# Patient Record
Sex: Female | Born: 1955 | Race: White | Hispanic: No | Marital: Married | Smoking: Former smoker
Health system: Southern US, Community
[De-identification: ages and names within clinical notes are randomized; demographics above are authoritative.]

## PROBLEM LIST (undated history)

## (undated) DIAGNOSIS — J0510 Acute epiglottitis without obstruction: Secondary | ICD-10-CM

## (undated) DIAGNOSIS — U071 COVID-19: Secondary | ICD-10-CM

## (undated) DIAGNOSIS — I10 Essential (primary) hypertension: Secondary | ICD-10-CM

## (undated) DIAGNOSIS — I2510 Atherosclerotic heart disease of native coronary artery without angina pectoris: Secondary | ICD-10-CM

## (undated) HISTORY — PX: APPENDECTOMY: SHX54

## (undated) HISTORY — PX: CARDIAC SURGERY: SHX584

---

## 2014-03-23 ENCOUNTER — Other Ambulatory Visit: Payer: Self-pay | Admitting: Endocrinology

## 2014-03-23 DIAGNOSIS — E041 Nontoxic single thyroid nodule: Secondary | ICD-10-CM

## 2014-04-26 ENCOUNTER — Ambulatory Visit
Admission: RE | Admit: 2014-04-26 | Discharge: 2014-04-26 | Disposition: A | Discharge status: Home / Self Care | Payer: BC Managed Care – PPO | Source: Ambulatory Visit | Attending: Endocrinology | Admitting: Endocrinology

## 2014-04-26 ENCOUNTER — Other Ambulatory Visit (HOSPITAL_COMMUNITY)
Admission: RE | Admit: 2014-04-26 | Discharge: 2014-04-26 | Disposition: A | Discharge status: Home / Self Care | Payer: BC Managed Care – PPO | Source: Ambulatory Visit | Attending: Interventional Radiology | Admitting: Interventional Radiology

## 2014-04-26 DIAGNOSIS — E041 Nontoxic single thyroid nodule: Secondary | ICD-10-CM | POA: Insufficient documentation

## 2015-08-10 ENCOUNTER — Encounter: Payer: Self-pay | Admitting: Internal Medicine

## 2019-01-11 ENCOUNTER — Other Ambulatory Visit: Payer: Self-pay

## 2019-01-11 ENCOUNTER — Emergency Department (HOSPITAL_BASED_OUTPATIENT_CLINIC_OR_DEPARTMENT_OTHER): Payer: BC Managed Care – PPO

## 2019-01-11 ENCOUNTER — Encounter (HOSPITAL_BASED_OUTPATIENT_CLINIC_OR_DEPARTMENT_OTHER): Payer: Self-pay | Admitting: Emergency Medicine

## 2019-01-11 ENCOUNTER — Inpatient Hospital Stay (HOSPITAL_BASED_OUTPATIENT_CLINIC_OR_DEPARTMENT_OTHER)
Admission: EM | Admit: 2019-01-11 | Discharge: 2019-01-12 | DRG: 153 | Disposition: A | Discharge status: Home / Self Care | Payer: BC Managed Care – PPO | Attending: Pulmonary Disease | Admitting: Pulmonary Disease

## 2019-01-11 DIAGNOSIS — Z791 Long term (current) use of non-steroidal anti-inflammatories (NSAID): Secondary | ICD-10-CM | POA: Diagnosis not present

## 2019-01-11 DIAGNOSIS — Z885 Allergy status to narcotic agent status: Secondary | ICD-10-CM | POA: Diagnosis not present

## 2019-01-11 DIAGNOSIS — Z9049 Acquired absence of other specified parts of digestive tract: Secondary | ICD-10-CM | POA: Diagnosis not present

## 2019-01-11 DIAGNOSIS — I480 Paroxysmal atrial fibrillation: Secondary | ICD-10-CM | POA: Diagnosis present

## 2019-01-11 DIAGNOSIS — J029 Acute pharyngitis, unspecified: Secondary | ICD-10-CM | POA: Diagnosis present

## 2019-01-11 DIAGNOSIS — Z87891 Personal history of nicotine dependence: Secondary | ICD-10-CM

## 2019-01-11 DIAGNOSIS — Z7982 Long term (current) use of aspirin: Secondary | ICD-10-CM | POA: Diagnosis not present

## 2019-01-11 DIAGNOSIS — I10 Essential (primary) hypertension: Secondary | ICD-10-CM | POA: Diagnosis present

## 2019-01-11 DIAGNOSIS — Z79890 Hormone replacement therapy (postmenopausal): Secondary | ICD-10-CM

## 2019-01-11 DIAGNOSIS — Z79899 Other long term (current) drug therapy: Secondary | ICD-10-CM | POA: Diagnosis not present

## 2019-01-11 DIAGNOSIS — E039 Hypothyroidism, unspecified: Secondary | ICD-10-CM | POA: Diagnosis present

## 2019-01-11 DIAGNOSIS — J0510 Acute epiglottitis without obstruction: Secondary | ICD-10-CM | POA: Diagnosis present

## 2019-01-11 DIAGNOSIS — R0603 Acute respiratory distress: Secondary | ICD-10-CM

## 2019-01-11 DIAGNOSIS — I2510 Atherosclerotic heart disease of native coronary artery without angina pectoris: Secondary | ICD-10-CM | POA: Diagnosis present

## 2019-01-11 HISTORY — DX: Atherosclerotic heart disease of native coronary artery without angina pectoris: I25.10

## 2019-01-11 HISTORY — DX: Essential (primary) hypertension: I10

## 2019-01-11 LAB — COMPREHENSIVE METABOLIC PANEL
ALT: 25 U/L (ref 0–44)
AST: 25 U/L (ref 15–41)
Albumin: 4.2 g/dL (ref 3.5–5.0)
Alkaline Phosphatase: 75 U/L (ref 38–126)
Anion gap: 11 (ref 5–15)
BUN: 15 mg/dL (ref 8–23)
CALCIUM: 9 mg/dL (ref 8.9–10.3)
CO2: 25 mmol/L (ref 22–32)
Chloride: 101 mmol/L (ref 98–111)
Creatinine, Ser: 0.83 mg/dL (ref 0.44–1.00)
GFR calc Af Amer: >60 mL/min (ref >60)
GFR calc non Af Amer: >60 mL/min (ref >60)
Glucose, Bld: 130 mg/dL — ABNORMAL HIGH (ref 70–99)
Potassium: 4 mmol/L (ref 3.5–5.1)
Sodium: 137 mmol/L (ref 135–145)
Total Bilirubin: 1.3 mg/dL — ABNORMAL HIGH (ref 0.3–1.2)
Total Protein: 6.8 g/dL (ref 6.5–8.1)

## 2019-01-11 LAB — CBC WITH DIFFERENTIAL/PLATELET
Abs Immature Granulocytes: 0.09 10*3/uL — ABNORMAL HIGH (ref 0.00–0.07)
Basophils Absolute: 0.1 10*3/uL (ref 0.0–0.1)
Basophils Relative: 0 %
Eosinophils Absolute: 0 10*3/uL (ref 0.0–0.5)
Eosinophils Relative: 0 %
HEMATOCRIT: 41.6 % (ref 36.0–46.0)
Hemoglobin: 13.8 g/dL (ref 12.0–15.0)
Immature Granulocytes: 1 %
LYMPHS ABS: 0.5 10*3/uL — AB (ref 0.7–4.0)
LYMPHS PCT: 3 %
MCH: 31.7 pg (ref 26.0–34.0)
MCHC: 33.2 g/dL (ref 30.0–36.0)
MCV: 95.6 fL (ref 80.0–100.0)
Monocytes Absolute: 1.2 10*3/uL — ABNORMAL HIGH (ref 0.1–1.0)
Monocytes Relative: 7 %
Neutro Abs: 16.6 10*3/uL — ABNORMAL HIGH (ref 1.7–7.7)
Neutrophils Relative %: 89 %
Platelets: 242 10*3/uL (ref 150–400)
RBC: 4.35 MIL/uL (ref 3.87–5.11)
RDW: 13.1 % (ref 11.5–15.5)
WBC: 18.4 10*3/uL — ABNORMAL HIGH (ref 4.0–10.5)
nRBC: 0 % (ref 0.0–0.2)

## 2019-01-11 LAB — CBC
HCT: 39.3 % (ref 36.0–46.0)
Hemoglobin: 13.3 g/dL (ref 12.0–15.0)
MCH: 32.3 pg (ref 26.0–34.0)
MCHC: 33.8 g/dL (ref 30.0–36.0)
MCV: 95.4 fL (ref 80.0–100.0)
Platelets: 261 10*3/uL (ref 150–400)
RBC: 4.12 MIL/uL (ref 3.87–5.11)
RDW: 13.2 % (ref 11.5–15.5)
WBC: 22.9 10*3/uL — AB (ref 4.0–10.5)
nRBC: 0 % (ref 0.0–0.2)

## 2019-01-11 LAB — MAGNESIUM: Magnesium: 1.9 mg/dL (ref 1.7–2.4)

## 2019-01-11 LAB — BASIC METABOLIC PANEL
Anion gap: 9 (ref 5–15)
BUN: 14 mg/dL (ref 8–23)
CO2: 22 mmol/L (ref 22–32)
Calcium: 8.8 mg/dL — ABNORMAL LOW (ref 8.9–10.3)
Chloride: 107 mmol/L (ref 98–111)
Creatinine, Ser: 0.84 mg/dL (ref 0.44–1.00)
GFR calc Af Amer: >60 mL/min (ref >60)
GFR calc non Af Amer: >60 mL/min (ref >60)
Glucose, Bld: 156 mg/dL — ABNORMAL HIGH (ref 70–99)
Potassium: 3.7 mmol/L (ref 3.5–5.1)
Sodium: 138 mmol/L (ref 135–145)

## 2019-01-11 LAB — MRSA PCR SCREENING: MRSA by PCR: NEGATIVE

## 2019-01-11 LAB — CREATININE, SERUM
Creatinine, Ser: 0.93 mg/dL (ref 0.44–1.00)
GFR calc Af Amer: >60 mL/min (ref >60)

## 2019-01-11 LAB — INFLUENZA PANEL BY PCR (TYPE A & B)
Influenza A By PCR: NEGATIVE
Influenza B By PCR: NEGATIVE

## 2019-01-11 MED ORDER — ONDANSETRON HCL 4 MG/2ML IJ SOLN: 4.0000 mg | Freq: Four times a day (QID) | INTRAMUSCULAR | Status: DC | PRN

## 2019-01-11 MED ORDER — DEXAMETHASONE SODIUM PHOSPHATE 10 MG/ML IJ SOLN
10.0000 mg | Freq: Once | INTRAMUSCULAR | Status: AC
  Administered 2019-01-11: 10 mg via INTRAVENOUS
  Filled 2019-01-11: qty 1

## 2019-01-11 MED ORDER — ALBUTEROL SULFATE (2.5 MG/3ML) 0.083% IN NEBU: 2.5000 mg | INHALATION_SOLUTION | RESPIRATORY_TRACT | Status: DC | PRN

## 2019-01-11 MED ORDER — ENOXAPARIN SODIUM 40 MG/0.4ML ~~LOC~~ SOLN
40.0000 mg | SUBCUTANEOUS | Status: DC
  Administered 2019-01-11: 40 mg via SUBCUTANEOUS
  Filled 2019-01-11: qty 0.4

## 2019-01-11 MED ORDER — LEVOTHYROXINE SODIUM 100 MCG/5ML IV SOLN
44.0000 ug | Freq: Every day | INTRAVENOUS | Status: DC
  Administered 2019-01-12: 44 ug via INTRAVENOUS
  Filled 2019-01-11: qty 5

## 2019-01-11 MED ORDER — DIPHENHYDRAMINE HCL 50 MG/ML IJ SOLN
12.5000 mg | Freq: Three times a day (TID) | INTRAMUSCULAR | Status: AC
  Administered 2019-01-11 – 2019-01-12 (×3): 12.5 mg via INTRAVENOUS
  Filled 2019-01-11 (×3): qty 1

## 2019-01-11 MED ORDER — PANTOPRAZOLE SODIUM 40 MG IV SOLR
40.0000 mg | Freq: Every day | INTRAVENOUS | Status: DC
  Administered 2019-01-11: 40 mg via INTRAVENOUS
  Filled 2019-01-11: qty 40

## 2019-01-11 MED ORDER — SODIUM CHLORIDE 0.9 % IV SOLN
3.0000 g | Freq: Four times a day (QID) | INTRAVENOUS | Status: DC
  Administered 2019-01-11 – 2019-01-12 (×4): 3 g via INTRAVENOUS
  Filled 2019-01-11 (×6): qty 3

## 2019-01-11 MED ORDER — SODIUM CHLORIDE 0.9 % IV SOLN
2.0000 g | Freq: Once | INTRAVENOUS | Status: AC
  Administered 2019-01-11: 2 g via INTRAVENOUS
  Filled 2019-01-11: qty 20

## 2019-01-11 MED ORDER — SODIUM CHLORIDE 0.9 % IV BOLUS
1000.0000 mL | Freq: Once | INTRAVENOUS | Status: AC
  Administered 2019-01-11: 1000 mL via INTRAVENOUS

## 2019-01-11 MED ORDER — METHYLPREDNISOLONE SODIUM SUCC 40 MG IJ SOLR
40.0000 mg | Freq: Two times a day (BID) | INTRAMUSCULAR | Status: DC
  Administered 2019-01-11 – 2019-01-12 (×2): 40 mg via INTRAVENOUS
  Filled 2019-01-11 (×2): qty 1

## 2019-01-11 MED ORDER — SODIUM CHLORIDE 0.9 % IV SOLN
INTRAVENOUS | Status: DC
  Administered 2019-01-11: 15:00:00 via INTRAVENOUS

## 2019-01-11 MED ORDER — METOPROLOL TARTRATE 5 MG/5ML IV SOLN
2.5000 mg | Freq: Four times a day (QID) | INTRAVENOUS | Status: DC
  Administered 2019-01-11 – 2019-01-12 (×2): 2.5 mg via INTRAVENOUS
  Filled 2019-01-11 (×3): qty 5

## 2019-01-11 MED ORDER — MORPHINE SULFATE (PF) 4 MG/ML IV SOLN
4.0000 mg | Freq: Once | INTRAVENOUS | Status: AC
  Administered 2019-01-11: 4 mg via INTRAVENOUS
  Filled 2019-01-11: qty 1

## 2019-01-11 MED ORDER — IOHEXOL 300 MG/ML  SOLN
100.0000 mL | Freq: Once | INTRAMUSCULAR | Status: AC | PRN
  Administered 2019-01-11: 100 mL via INTRAVENOUS

## 2019-01-11 MED ORDER — SODIUM CHLORIDE 0.9 % IV SOLN
INTRAVENOUS | Status: DC | PRN
  Administered 2019-01-11: 500 mL via INTRAVENOUS
  Administered 2019-01-12: 1000 mL via INTRAVENOUS

## 2019-01-11 MED ORDER — LABETALOL HCL 5 MG/ML IV SOLN: 20.0000 mg | INTRAVENOUS | Status: DC | PRN

## 2019-01-11 NOTE — ED Provider Notes (Addendum)
Physical Exam  BP (!) 122/59 (BP Location: Right Arm)   Pulse (!) 101   Temp 99.4 F (37.4 C) (Oral)   Resp (!) 22   Ht 5\' 2"  (1.575 m)   Wt 75.3 kg   LMP  (LMP Unknown)   SpO2 93%   BMI 30.36 kg/m   Assumed care from Dr. Patria Mane who initially saw patient at [facility] at 12:38 PM. Briefly, the patient is a 63 y.o. female with PMHx of  has a past medical history of Coronary artery disease and Hypertension. here with sore throat, hoarse voice, and fever. Found to have epiglottal thickening on CT soft tissue neck concerning for epiglottitis. Supraglottic airway lumen as small as 6 mm. Pt comfortable with HOB 30 degrees. Instructed pt not to talk during history but shakes her head no to indicate that she is not feeling worse during transport.   Labs Reviewed  CBC WITH DIFFERENTIAL/PLATELET - Abnormal; Notable for the following components:      Result Value   WBC 18.4 (*)    Neutro Abs 16.6 (*)    Lymphs Abs 0.5 (*)    Monocytes Absolute 1.2 (*)    Abs Immature Granulocytes 0.09 (*)    All other components within normal limits  COMPREHENSIVE METABOLIC PANEL - Abnormal; Notable for the following components:   Glucose, Bld 130 (*)    Total Bilirubin 1.3 (*)    All other components within normal limits  INFLUENZA PANEL BY PCR (TYPE A & B)    Course of Care:   Physical Exam Vitals signs and nursing note reviewed.  Constitutional:      Appearance: She is well-developed. She is ill-appearing. She is not diaphoretic.     Comments: Sitting comfortably in bed.  HENT:     Head: Normocephalic and atraumatic.     Mouth/Throat:     Mouth: Mucous membranes are dry.  Eyes:     General:        Right eye: No discharge.        Left eye: No discharge.     Conjunctiva/sclera: Conjunctivae normal.     Comments: EOMs normal to gross examination.  Neck:     Musculoskeletal: Normal range of motion.  Cardiovascular:     Rate and Rhythm: Regular rhythm. Tachycardia present.   Comments: Tachycardia to 102.  Abdominal:     General: There is no distension.  Musculoskeletal: Normal range of motion.  Skin:    General: Skin is warm and dry.  Neurological:     Mental Status: She is alert.     Comments: Cranial nerves intact to gross observation. Patient moves extremities without difficulty.  Psychiatric:        Behavior: Behavior normal.        Thought Content: Thought content normal.        Judgment: Judgment normal.     ED Course/Procedures   Clinical Course as of Jan 10 1330  Mon Jan 11, 2019  1232 Direct call placed to Dr. Pollyann Kennedy to notify that patient is here is ED.    [AM]  1329 Spoke with Dr. Grayland Ormond of PCCM who will evaluated and admit.  Appreciate his involvement in the care of this patient.   [AM]    Clinical Course User Index [AM] Elisha Ponder, PA-C    Procedures  MDM   Patient transferred from [facility] for epiglottitis.  Airway patent at this time, patient denies any sensation of shortness of  breath or decompensation during route.  Pulse oximetry was 93% on room air on arrival.  Placed on 2 L of nasal cannula.  Dr. Pollyann Kennedy notified and is here in the emergency department to perform nasopharyngeal scope with patient.  Appreciate his involvement.  Additionally, Decadron ordered.      Delia Chimes 01/11/19 1331    Wynetta Fines, MD 01/13/19 1504

## 2019-01-11 NOTE — Progress Notes (Signed)
Pharmacy Antibiotic Note  Olivia Lawrence is a 63 y.o. female admitted on 01/11/2019 with epiglottitis. Pharmacy has been consulted for unasyn dosing. Pt is febrile with Tmax 100.7 and WBC is elevated at 18.4. SCr is WNL.   Plan: Unasyn 3g IV Q6H F/u renal fxn, C&S, clinical status  Height: 5\' 2"  (157.5 cm) Weight: 166 lb (75.3 kg) IBW/kg (Calculated) : 50.1  Temp (24hrs), Avg:100.1 F (37.8 C), Min:99.4 F (37.4 C), Max:100.7 F (38.2 C)  Recent Labs  Lab 01/11/19 0849  WBC 18.4*  CREATININE 0.83    Estimated Creatinine Clearance: 64.2 mL/min (by C-G formula based on SCr of 0.83 mg/dL).    Allergies  Allergen Reactions  . Dilaudid [Hydromorphone Hcl] Other (See Comments)    Pt states she coded after dilaudid administration    Antimicrobials this admission: Unasyn 3/16>> CTX x 1 3/16  Dose adjustments this admission: N/A  Microbiology results: Pending  Thank you for allowing pharmacy to be a part of this patient's care.  Ebenezer Mccaskey, Drake Leach 01/11/2019 2:25 PM

## 2019-01-11 NOTE — Progress Notes (Signed)
eLink Physician-Brief Progress Note Patient Name: Olivia Lawrence DOB: 1956/06/21 MRN: [id number]   Date of Service  01/11/2019  HPI/Events of Note  Episode of elevated HR into 130's. Now NSR with rate = 83. Hx of AFIB on Coreg and Flecainide. No NPO for airway concerns.   eICU Interventions  Will order: 1. Metoprolol 2.5 mg IV now and Q 6 hours. Hold for SBP < 105 or HR < 60.  2. BMP and Mg++ level STAT.      Intervention Category Major Interventions: Arrhythmia - evaluation and management  Sommer,Steven Eugene 01/11/2019, 8:48 PM

## 2019-01-11 NOTE — ED Triage Notes (Signed)
Pt here via [facility] from [facility], they report pt has been diagnosed with epiglottitis, has had a fever and sore throat x 1 day, neg strep. A&o x4.

## 2019-01-11 NOTE — ED Notes (Addendum)
ED TO INPATIENT HANDOFF REPORT  ED Nurse Name and Phone #: Dorathy Daft [phone]  S Name/Age/Gender Olivia Lawrence 63 y.o. female Room/Bed: 045C/045C  Code Status   Code Status: Full Code  Home/SNF/Other Home Patient oriented to: self, place, time and situation Is this baseline? Yes   Triage Complete: Triage complete  Chief Complaint THROAT PAIN, CONGESTION, COUGH, FEVER  Triage Note Pt presents with fever, sore throat x 1 day. Was tested for strep at [facility] yesterday and it was negative. 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   Pt here via [facility] from [facility], they report pt has been diagnosed with epiglottitis, has had a fever and sore throat x 1 day, neg strep. A&o x4.    Allergies Allergies  Allergen Reactions  . Dilaudid [Hydromorphone Hcl] Other (See Comments)    Pt states she coded after dilaudid administration    Level of Care/Admitting Diagnosis ED Disposition    ED Disposition Condition Comment   Admit  Hospital Area: [facility] [[id number]]  Level of Care: ICU [6]  Diagnosis: Epiglottitis [[id number]]  Admitting Physician: Oretha Milch [[id number]]  Attending Physician: Oretha Milch [[id number]]  Estimated length of stay: past midnight tomorrow  Certification:: I certify this patient will need inpatient services for at least 2 midnights  PT Class (Do Not Modify): Inpatient [101]  PT Acc Code (Do Not Modify): Private [1]       B Medical/Surgery History Past Medical History:  Diagnosis Date  . Coronary artery disease   . Hypertension    Past Surgical History:  Procedure Laterality Date  . APPENDECTOMY       A IV Location/Drains/Wounds Patient Lines/Drains/Airways Status   Active Line/Drains/Airways    Name:   Placement date:   Placement time:   Site:   Days:   Peripheral IV 01/11/19 Right Antecubital   01/11/19    0848    Antecubital   less than 1          Intake/Output Last 24  hours  Intake/Output Summary (Last 24 hours) at 01/11/2019 1519 Last data filed at 01/11/2019 1112 Gross per 24 hour  Intake 1100.52 ml  Output -  Net 1100.52 ml    Labs/Imaging Results for orders placed or performed during the hospital encounter of 01/11/19 (from the past 48 hour(s))  CBC with Differential/Platelet     Status: Abnormal   Collection Time: 01/11/19  8:49 AM  Result Value Ref Range   WBC 18.4 (H) 4.0 - 10.5 K/uL   RBC 4.35 3.87 - 5.11 MIL/uL   Hemoglobin 13.8 12.0 - 15.0 g/dL   HCT 80.8 81.1 - 03.1 %   MCV 95.6 80.0 - 100.0 fL   MCH 31.7 26.0 - 34.0 pg   MCHC 33.2 30.0 - 36.0 g/dL   RDW 59.4 58.5 - 92.9 %   Platelets 242 150 - 400 K/uL   nRBC 0.0 0.0 - 0.2 %   Neutrophils Relative % 89 %   Neutro Abs 16.6 (H) 1.7 - 7.7 K/uL   Lymphocytes Relative 3 %   Lymphs Abs 0.5 (L) 0.7 - 4.0 K/uL   Monocytes Relative 7 %   Monocytes Absolute 1.2 (H) 0.1 - 1.0 K/uL   Eosinophils Relative 0 %   Eosinophils Absolute 0.0 0.0 - 0.5 K/uL   Basophils Relative 0 %   Basophils Absolute 0.1 0.0 - 0.1 K/uL   Immature Granulocytes 1 %   Abs Immature Granulocytes  0.09 (H) 0.00 - 0.07 K/uL    Comment: Performed at [facility], [address] [place], [place] [address]  Comprehensive metabolic panel     Status: Abnormal   Collection Time: 01/11/19  8:49 AM  Result Value Ref Range   Sodium 137 135 - 145 mmol/L   Potassium 4.0 3.5 - 5.1 mmol/L   Chloride 101 98 - 111 mmol/L   CO2 25 22 - 32 mmol/L   Glucose, Bld 130 (H) 70 - 99 mg/dL   BUN 15 8 - 23 mg/dL   Creatinine, Ser 6.04 0.44 - 1.00 mg/dL   Calcium 9.0 8.9 - 54.0 mg/dL   Total Protein 6.8 6.5 - 8.1 g/dL   Albumin 4.2 3.5 - 5.0 g/dL   AST 25 15 - 41 U/L   ALT 25 0 - 44 U/L   Alkaline Phosphatase 75 38 - 126 U/L   Total Bilirubin 1.3 (H) 0.3 - 1.2 mg/dL   GFR calc non Af Amer >60 >60 mL/min   GFR calc Af Amer >60 >60 mL/min   Anion gap 11 5 - 15    Comment: Performed at [facility], [address] [place], [place] [address]  Influenza panel by PCR (type A & B)     Status: None   Collection Time: 01/11/19  8:49 AM  Result Value Ref Range   Influenza A By PCR NEGATIVE NEGATIVE   Influenza B By PCR NEGATIVE NEGATIVE    Comment: (NOTE) The Xpert Xpress Flu assay is intended as an aid in the diagnosis of  influenza and should not be used as a sole basis for treatment.  This  assay is FDA approved for nasopharyngeal swab specimens only. Nasal  washings and aspirates are unacceptable for Xpert Xpress Flu testing. Performed at [facility], [address], [place], [place] [address]    Dg Chest 2 View  Result Date: 01/11/2019 CLINICAL DATA:  63 year old female with fever, and sore throat for 1 day EXAM: CHEST - 2 VIEW COMPARISON:  Prior chest x-ray 05/31/2017, 10/24/2015 FINDINGS: The lungs are clear and negative for focal airspace consolidation, pulmonary edema or suspicious pulmonary nodule. A nodular opacity projecting over the left lung base is only visible on the frontal view and may be within the breast tissue. This nodule is visible and unchanged across multiple prior studies dating back to 2016. No pleural effusion or pneumothorax. Cardiac and mediastinal contours are within normal limits. Atherosclerotic calcifications are visible in the transverse aorta. No acute fracture or lytic or blastic osseous lesions. The visualized upper abdominal bowel gas pattern is unremarkable. IMPRESSION: No active cardiopulmonary disease. Electronically Signed   By: Malachy Moan M.D.   On: 01/11/2019 08:57   Ct Soft Tissue Neck W Contrast  Result Date: 01/11/2019 CLINICAL DATA:  Sore throat, stridor and fever beginning yesterday. Assess for epiglottitis or tonsillitis. EXAM: CT NECK WITH CONTRAST TECHNIQUE: Multidetector CT imaging of the neck was performed using the standard protocol following the bolus administration of intravenous contrast. CONTRAST:  OMNIPAQUE IOHEXOL 300 MG/ML   SOLN COMPARISON:  None. FINDINGS: Pharynx and larynx: The oropharynx and posterior nasopharynx appear normal. Tonsillar regions appear normal. There is pronounced inflammatory edema of the mucosal and submucosal structures in the supraglottic region. This includes some thickening of the epiglottis. Infraglottic trachea appears widely patent. Salivary glands: Normal Thyroid: Normal Lymph nodes: No enlarged or low-density nodes on either side of the neck. Normal bilateral  nodes. Vascular: Atherosclerotic calcification at the carotid bifurcations. Limited intracranial: Normal Visualized orbits: Normal Mastoids and visualized paranasal sinuses: Clear Skeleton: Ordinary mid cervical spondylosis. Upper chest: Mild pleural and parenchymal scarring at the apices. Other: None IMPRESSION: Mucosal and submucosal inflammatory edema of the epiglottis and supraglottic airway. NarrowingPaulina Fusiupraglottic airway with the lumen as small as 6 mm. Electronically Signed   By: Mark  Shogry M.D.   On: 01/11/2019 09:53    Pending Labs Unresulted Labs (From admission, onward)    Start     Ordered   01/18/19 0500  Creatinine, serum  (enoxaparin (LOVENOX)    CrCl >/= 30 ml/min)  Weekly,   R    Comments:  while on enoxaparin therapy    01/11/19 1420   01/12/19 0500  CBC  Tomorrow morning,   R     01/11/19 1420   01/12/19 0500  Basic metabolic panel  Tomorrow morning,   R     01/11/19 1420   01/12/19 0500  Magnesium  Tomorrow morning,   R     01/11/19 1420   01/12/19 0500  Phosphorus  Tomorrow morning,   R     01/11/19 1420   01/11/19 1414  HIV antibody (Routine Testing)  Once,   R     01/11/19 1420   01/11/19 1414  CBC  (enoxaparin (LOVENOX)    CrCl >/= 30 ml/min)  Once,   R    Comments:  Baseline for enoxaparin therapy IF NOT ALREADY DRAWN.  Notify MD if PLT < 100 K.    01/11/19 1420   01/11/19 1414  Creatinine, serum  (enoxaparin (LOVENOX)    CrCl >/= 30 ml/min)  Once,   R    Comments:  Baseline for enoxaparin  therapy IF NOT ALREADY DRAWN.    01/11/19 1420          Vitals/Pain Today's Vitals   01/11/19 1315 01/11/19 1400 01/11/19 1456 01/11/19 1500  BP: (!) 106/59 124/70  136/66  Pulse: (!) 102 (!) 102  100  Resp: Temp:      TempSrc:      SpO2: 98% 95%  97%  Weight:      Height:      PainSc:   6      Isolation Precautions Droplet precaution  Medications Medications  0.9 %  sodium chloride infusion ( Intravenous Transfusing/Transfer 01/11/19 1112)  enoxaparin (LOVENOX) injection 40 mg (has no administration in time range)  0.9 %  sodium chloride infusion ( Intravenous New Bag/Given 01/11/19 1501)  ondansetron (ZOFRAN) injection 4 mg (has no administration in time range)  pantoprazole (PROTONIX) injection 40 mg (has no administration in time range)  albuterol (PROVENTIL) (2.5 MG/3ML) 0.083% nebulizer solution 2.5 mg (has no administration in time range)  labetalol (NORMODYNE,TRANDATE) injection 20 mg (has no administration in time range)  Ampicillin-Sulbactam (UNASYN) 3 g in sodium chloride 0.9 % 100 mL IVPB (3 g Intravenous New Bag/Given 01/11/19 1501)  levothyroxine (SYNTHROID, LEVOTHROID) injection 44 mcg (has no administration in time range)  sodium chloride 0.9 % bolus 1,000 mL (0 mLs Intravenous Stopped 01/11/19 1010)  morphine 4 MG/ML injection 4 mg (4 mg Intravenous Given 01/11/19 0853)  iohexol (OMNIPAQUE) 300 MG/ML solution 100 mL (100 mLs Intravenous Contrast Given 01/11/19 0932)  cefTRIAXone (ROCEPHIN) 2 g in sodium chloride 0.9 % 100 mL IVPB (0 g Intravenous Stopped 01/11/19 1112)  dexamethasone (DECADRON) injection 10 mg (10 mg Intravenous Given 01/11/19 1317)  morphine  4 MG/ML injection 4 mg (4 mg Intravenous Given 01/11/19 1457)    Mobility walks Low fall risk   Focused Assessments Pulmonary Assessment Handoff:  Lung sounds:  O2 device: 2L [place]      R Recommendations: See Admitting Provider Note  Report given to:   Additional Notes:

## 2019-01-11 NOTE — ED Triage Notes (Signed)
Pt presents with fever, sore throat x 1 day. Was tested for strep at [facility] yesterday and it was negative. 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

## 2019-01-11 NOTE — H&P (Signed)
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  She had nausea and vomiting in route and difficulty clearing her throat.  She was seen in ambulatory clinic on 3/15 and strep throat was negative she had not picked up antibiotics from her pharmacy yet.  ED was concerned about deep space infection, labs showed leukocytosis with left shift.  She did not have any sick contacts or any risk factors for covid19 CT neck showed narrowing of the supraglottic airway.  She was transferred to [facility] ED where she was seen by ENT Dr. Pollyann Kennedy, laryngoscopy showed diffuse edema of arytenoids, false cords and epiglottis with some pooling of secretions  She reports that she feels subjectively better since 7 AM she is able to speak better although husband states that speech has changed over the last day.  She is able to cough minimal amount of white phlegm  PAST MEDICAL HISTORY :   has a past medical history of Coronary artery disease and Hypertension.  has a past surgical history that includes Appendectomy. Prior to Admission medications   Medication Sig Start Date End Date Taking? Authorizing Provider  amLODipine (NORVASC) 5 MG tablet Take 5 mg by mouth daily.   Yes [provider]  aspirin EC 81 MG tablet Take 81 mg by mouth daily.   Yes [provider]  carvedilol (COREG) 12.5 MG tablet Take 12.5 mg by mouth 2 (two) times daily with a meal.   Yes [provider]  escitalopram (LEXAPRO) 10 MG tablet Take 10 mg by mouth daily.   Yes [provider]  flecainide (TAMBOCOR) 50 MG tablet Take 50 mg by mouth 2 (two) times daily.   Yes [provider]  levothyroxine (SYNTHROID, LEVOTHROID) 88 MCG tablet Take 88 mcg by mouth daily before breakfast.   Yes [provider]  lisinopril (PRINIVIL,ZESTRIL) 40 MG tablet Take 40 mg by mouth daily.   Yes [provider]  meloxicam (MOBIC) 15 MG tablet Take 15 mg by mouth daily.   Yes [provider]  nitroGLYCERIN (NITROSTAT) 0.4 MG SL tablet Place 0.4 mg under the tongue every 5 (five) minutes as needed for chest pain. 06/13/17  Yes [provider]  omeprazole (PRILOSEC) 40 MG capsule Take 40 mg by mouth daily.   Yes [provider]  ranitidine (ZANTAC) 150 MG tablet Take 150 mg by mouth 2 (two) times daily. 02/13/18  Yes [provider]   Allergies  Allergen Reactions   Dilaudid [Hydromorphone Hcl] Other (See Comments)    Pt states she coded after dilaudid administration    FAMILY HISTORY:  family history is not on file. SOCIAL HISTORY:  reports that she has quit smoking. She has never used smokeless tobacco. She reports current alcohol use of about 2.0 standard drinks of alcohol per week. She reports that she does  not use drugs.  REVIEW OF SYSTEMS:   Positive as above No history of recent travel  Constitutional: Negative for  weight loss, malaise/fatigue and diaphoresis.  HENT: Negative for hearing loss, ear pain, nosebleeds, congestion, sore throat, neck pain, tinnitus and ear discharge.   Eyes: Negative for blurred vision, double vision, photophobia, pain, discharge and redness.  Respiratory: Negative for cough, hemoptysis, sputum production,    Cardiovascular: Negative for chest pain, palpitations,  orthopnea, claudication, leg swelling and PND.  Gastrointestinal: Negative for heartburn, nausea, vomiting, abdominal pain, diarrhea, constipation, blood in stool and melena.  Genitourinary: Negative for dysuria, urgency, frequency, hematuria and flank pain.  Musculoskeletal: Negative for myalgias, back pain, joint pain and falls.  Skin: Negative for itching and rash.  Neurological: Negative for dizziness, tingling, tremors, sensory change, speech change, focal weakness, seizures, loss of consciousness, weakness and headaches.  Endo/Heme/Allergies: Negative for environmental allergies and polydipsia. Does not bruise/bleed easily.  SUBJECTIVE:   VITAL SIGNS: Temp:  [99.4 F (37.4 C)-100.7 F (38.2 C)] 99.4 F (37.4 C) (03/16 1222) Pulse Rate:  [96-104] 100 (03/16 1500) Resp:  [12-24] 18 (03/16 1500) BP: (106-150)/(59-70) 136/66 (03/16 1500) SpO2:  [93 %-99 %] 97 % (03/16 1500) Weight:  [75.3 kg] 75.3 kg (03/16 0800)  PHYSICAL EXAMINATION: General: Acutely ill, no distress, sitting upright, able to speak in full sentences Neuro: Alert, interactive, nonfocal HEENT: No pallor, icterus, no neck swelling or lymphadenopathy Cardiovascular: S1-S2 normal, regular monitor Lungs: Clear breath sounds bilateral, no stridor Abdomen: Soft and nontender Musculoskeletal: No deformity Skin: No rash, husband states erythema over neck has gone down  Recent Labs  Lab 01/11/19 0849  NA 137  K 4.0  CL 101  CO2 25  BUN 15  CREATININE 0.83  GLUCOSE 130*   Recent Labs  Lab 01/11/19 0849  HGB 13.8  HCT 41.6  WBC 18.4*  PLT 242   Dg Chest 2 View  Result Date: 01/11/2019 CLINICAL DATA:  63 year old female with fever, and sore throat for 1 day EXAM: CHEST - 2 VIEW COMPARISON:  Prior chest x-ray 05/31/2017, 10/24/2015 FINDINGS: The lungs are clear and negative for focal airspace consolidation, pulmonary edema or suspicious pulmonary nodule. A nodular opacity projecting over the left lung base is  only visible on the frontal view and may be within the breast tissue. This nodule is visible and unchanged across multiple prior studies dating back to 2016. No pleural effusion or pneumothorax. Cardiac and mediastinal contours are within normal limits. Atherosclerotic calcifications are visible in the transverse aorta. No acute fracture or lytic or blastic osseous lesions. The visualized upper abdominal bowel gas pattern is unremarkable. IMPRESSION: No active cardiopulmonary disease. Electronically Signed   By: Malachy Moan M.D.   On: 01/11/2019 08:57   Ct Soft Tissue Neck W Contrast  Result Date: 01/11/2019 CLINICAL DATA:  Sore throat, stridor and fever beginning yesterday. Assess for epiglottitis or tonsillitis. EXAM: CT NECK WITH CONTRAST TECHNIQUE: Multidetector CT imaging of the neck was performed using the standard protocol following the bolus administration of intravenous contrast. CONTRAST:  OMNIPAQUE IOHEXOL 300 MG/ML  SOLN COMPARISON:  None. FINDINGS: Pharynx and larynx: The oropharynx and posterior nasopharynx appear normal. Tonsillar regions appear normal. There is pronounced inflammatory edema of the mucosal and submucosal structures in the supraglottic region. This includes some thickening of the epiglottis. Infraglottic trachea appears widely patent. Salivary glands: Normal Thyroid: Normal Lymph nodes: No enlarged or low-density nodes on either side of the neck. Normal bilateral nodes. Vascular: Atherosclerotic  calcification at the carotid bifurcations. Limited intracranial: Normal Visualized orbits: Normal Mastoids and visualized paranasal sinuses: Clear Skeleton: Ordinary mid cervical spondylosis. Upper chest: Mild pleural and parenchymal scarring at the apices. Other: None IMPRESSION: Mucosal and submucosal inflammatory edema of the epiglottis and supraglottic airway. Narrowing of the supraglottic airway with the lumen as small as 6 mm. Electronically Signed   By: Paulina Fusi M.D.    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/at risk for airway compromise She has fever and leukocytosis Of note she is also on lisinopril but this does not seem to be angioedema  -Admit to ICU for monitoring -IV Unasyn -IV Solu-Medrol 40 every 12 -IV Protonix 40 daily -IV Benadryl  -She has no true stridor at present but will need close monitoring for deterioration of her airway, if this happens prepare for difficult airway and ENT should be called   Hypertension-we will use hydralazine as needed to she can take p.o., once able will resume oral meds except lisinopril  Paroxysmal atrial fibrillation-flecainide will be held for 24 hours  Subacute Lovenox for DVT prophylaxis NP.o. for next 24 hours  Cyril Mourning MD. [facility] Pager [phone] If no response call [phone]    01/11/2019, 3:33 PM

## 2019-01-11 NOTE — ED Provider Notes (Signed)
[facility] EMERGENCY DEPARTMENT Provider Note   CSN: [id number] Arrival date & time: 01/11/19  0756    History   Chief Complaint Chief Complaint  Patient presents with  . flu-like sx    HPI Olivia Lawrence is a 63 y.o. female.     HPI Patient is a 63 year old female presents the emergency department with worsening sore throat over the past 24 hours.  Febrile at home.  Nausea and vomiting x1 in route.  She feels full in her throat and has been clearing her throat for several days per the husband.  She states her symptoms significantly worsened today.  She feels like it is affecting her ability to swallow.  She was seen yesterday at a clinic and was checked for strep which was negative.  She was discharged home with a prescription for antibiotics which she has not picked up from the pharmacy yet.  No stridor at rest.  Able to speak in full sentences.  Febrile today.  Feels generally poor.  Past Medical History:  Diagnosis Date  . Coronary artery disease   . Hypertension     There are no active problems to display for this patient.   Past Surgical History:  Procedure Laterality Date  . APPENDECTOMY       OB History   No obstetric history on file.      Home Medications    Prior to Admission medications   Medication Sig Start Date End Date Taking? Authorizing Provider  amLODipine (NORVASC) 5 MG tablet Take 5 mg by mouth daily.   Yes [provider]  aspirin EC 81 MG tablet Take 81 mg by mouth daily.   Yes [provider]  carvedilol (COREG) 12.5 MG tablet Take 12.5 mg by mouth 2 (two) times daily with a meal.   Yes [provider]  escitalopram (LEXAPRO) 10 MG tablet Take 10 mg by mouth daily.   Yes [provider]  flecainide (TAMBOCOR) 50 MG tablet Take 50 mg by mouth 2 (two) times daily.   Yes [provider]  levothyroxine (SYNTHROID, LEVOTHROID) 88 MCG tablet Take 88 mcg by mouth daily before breakfast.   Yes  [provider]  lisinopril (PRINIVIL,ZESTRIL) 40 MG tablet Take 40 mg by mouth daily.   Yes [provider]  meloxicam (MOBIC) 15 MG tablet Take 15 mg by mouth daily.   Yes [provider]  omeprazole (PRILOSEC) 40 MG capsule Take 40 mg by mouth daily.   Yes [provider]    Family History History reviewed. No pertinent family history.  Social History Social History   Tobacco Use  . Smoking status: Former Games developer  . Smokeless tobacco: Never Used  Substance Use Topics  . Alcohol use: Yes    Alcohol/week: 2.0 standard drinks    Types: 2 Glasses of wine per week  . Drug use: Never     Allergies   Dilaudid [hydromorphone hcl]   Review of Systems Review of Systems  All other systems reviewed and are negative.    Physical Exam Updated Vital Signs BP (!) 150/66 (BP Location: Right Arm)   Pulse 99   Temp (!) 100.7 F (38.2 C) (Oral)   Resp 20   Ht 5\' 2"  (1.575 m)   Wt 75.3 kg   LMP  (LMP Unknown)   SpO2 96%   BMI 30.36 kg/m   Physical Exam Vitals signs and nursing note reviewed.  Constitutional:      General: She is not  in acute distress.    Appearance: She is well-developed.  HENT:     Head: Normocephalic and atraumatic.     Comments: Uvula midline.  Posterior pharynx otherwise normal.  Tolerating secretions.  No stridor at rest.  Mild possible swelling of her anterior neck and mild erythema of her anterior neck.  No swelling under her tongue.  No obvious dental tenderness.  No trismus.  Mild tenderness of anterior neck without focal tenderness. Neck:     Musculoskeletal: Normal range of motion.  Cardiovascular:     Rate and Rhythm: Normal rate and regular rhythm.     Heart sounds: Normal heart sounds.  Pulmonary:     Effort: Pulmonary effort is normal.     Breath sounds: Normal breath sounds.  Abdominal:     General: There is no distension.     Palpations: Abdomen is soft.     Tenderness: There is no abdominal  tenderness.  Musculoskeletal: Normal range of motion.  Skin:    General: Skin is warm and dry.  Neurological:     Mental Status: She is alert and oriented to person, place, and time.  Psychiatric:        Judgment: Judgment normal.      ED Treatments / Results  Labs (all labs ordered are listed, but only abnormal results are displayed) Labs Reviewed  CBC WITH DIFFERENTIAL/PLATELET - Abnormal; Notable for the following components:      Result Value   WBC 18.4 (*)    Neutro Abs 16.6 (*)    Lymphs Abs 0.5 (*)    Monocytes Absolute 1.2 (*)    Abs Immature Granulocytes 0.09 (*)    All other components within normal limits  COMPREHENSIVE METABOLIC PANEL - Abnormal; Notable for the following components:   Glucose, Bld 130 (*)    Total Bilirubin 1.3 (*)    All other components within normal limits  INFLUENZA PANEL BY PCR (TYPE A & B)    EKG None  Radiology Dg Chest 2 View  Result Date: 01/11/2019 CLINICAL DATA:  63 year old female with fever, and sore throat for 1 day EXAM: CHEST - 2 VIEW COMPARISON:  Prior chest x-ray 05/31/2017, 10/24/2015 FINDINGS: The lungs are clear and negative for focal airspace consolidation, pulmonary edema or suspicious pulmonary nodule. A nodular opacity projecting over the left lung base is only visible on the frontal view and may be within the breast tissue. This nodule is visible and unchanged across multiple prior studies dating back to 2016. No pleural effusion or pneumothorax. Cardiac and mediastinal contours are within normal limits. Atherosclerotic calcifications are visible in the transverse aorta. No acute fracture or lytic or blastic osseous lesions. The visualized upper abdominal bowel gas pattern is unremarkable. IMPRESSION: No active cardiopulmonary disease. Electronically Signed   By: Malachy Moan M.D.   On: 01/11/2019 08:57   Ct Soft Tissue Neck W Contrast  Result Date: 01/11/2019 CLINICAL DATA:  Sore throat, stridor and fever beginning  yesterday. Assess for epiglottitis or tonsillitis. EXAM: CT NECK WITH CONTRAST TECHNIQUE: Multidetector CT imaging of the neck was performed using the standard protocol following the bolus administration of intravenous contrast. CONTRAST:  OMNIPAQUE IOHEXOL 300 MG/ML  SOLN COMPARISON:  None. FINDINGS: Pharynx and larynx: The oropharynx and posterior nasopharynx appear normal. Tonsillar regions appear normal. There is pronounced inflammatory edema of the mucosal and submucosal structures in the supraglottic region. This includes some thickening of the epiglottis. Infraglottic trachea appears widely patent. Salivary glands: Normal Thyroid: Normal Lymph  nodes: No enlarged or low-density nodes on either side of the neck. Normal bilateral nodes. Vascular: Atherosclerotic calcification at the carotid bifurcations. Limited intracranial: Normal Visualized orbits: Normal Mastoids and visualized paranasal sinuses: Clear Skeleton: Ordinary mid cervical spondylosis. Upper chest: Mild pleural and parenchymal scarring at the apices. Other: None IMPRESSION: Mucosal and submucosal inflammatory edema of the epiglottis and supraglottic airway. Narrowing of the supraglottic airway with the lumen as small as 6 mm. Electronically Signed   By: Paulina Fusi M.D.   On: 01/11/2019 09:53    Procedures .Critical Care Performed by: Azalia Bilis, MD Authorized by: Azalia Bilis, MD   Critical care provider statement:    Critical care time (minutes):  31   Critical care was time spent personally by me on the following activities:  Discussions with consultants, evaluation of patient's response to treatment, examination of patient, ordering and performing treatments and interventions, ordering and review of laboratory studies, ordering and review of radiographic studies, pulse oximetry, re-evaluation of patient's condition, obtaining history from patient or surrogate and review of old charts   (including critical care time)   Medications Ordered in ED Medications  cefTRIAXone (ROCEPHIN) 2 g in sodium chloride 0.9 % 100 mL IVPB (2 g Intravenous New Bag/Given 01/11/19 1032)  0.9 %  sodium chloride infusion (500 mLs Intravenous New Bag/Given 01/11/19 1030)  sodium chloride 0.9 % bolus 1,000 mL (0 mLs Intravenous Stopped 01/11/19 1010)  morphine 4 MG/ML injection 4 mg (4 mg Intravenous Given 01/11/19 0853)  iohexol (OMNIPAQUE) 300 MG/ML solution 100 mL (100 mLs Intravenous Contrast Given 01/11/19 0932)     Initial Impression / Assessment and Plan / ED Course  I have reviewed the triage vital signs and the nursing notes.  Pertinent labs & imaging results that were available during my care of the patient were reviewed by me and considered in my medical decision making (see chart for details).   Concern for deep space neck infection versus epiglottitis given fever, white count, pain in her throat.  No stridor.  Labs and imaging pending.  Strep negative at the clinic yesterday.  No recent travel.  No sick contacts.  No recent exposure to any patients under investigation for Covid-19        10:35 AM I spoke with Dr Pollyann Kennedy, ENT who will see the patient in the ER for possible bedside nasopharyngoscopy to evaluate for epiglotitis. Will plan on admission to the medicine service which will need to be arranged after evaluation by Dr Pollyann Kennedy to determine appropriate level of care (ICU vs Stepdown vs floor). At this time she is without respiratory distress. No stridor at rest. Tolerating secretions. Able to lay back at [facility] @30  degrees without difficulty. IV rocephin now  I personally viewed the patient's CT imaging  Case was discussed with Dr. Ranae Palms, ED physician at [facility] who accepts the patient in transfer  Patient will be transported ER to ER by our critical care transport team     Final Clinical Impressions(s) / ED Diagnoses   Final diagnoses:  Epiglottitis    ED Discharge Orders    None       Azalia Bilis, MD 01/11/19 1043

## 2019-01-11 NOTE — ED Notes (Signed)
Patient transported to CT 

## 2019-01-11 NOTE — ED Notes (Signed)
ED Provider at bedside. 

## 2019-01-11 NOTE — ED Notes (Addendum)
Report given to charge nurse at [facility]

## 2019-01-11 NOTE — Consult Note (Signed)
Reason for Consult: Epiglottitis Referring Physician: Wynetta Fines, MD  Olivia Lawrence is an 63 y.o. female.  HPI: Less than 24-hour history of severe sore throat, worse on the left side, trouble swallowing, feeling as though it is hard to breathe but not actually having any difficulty with breathing or with stridor.  Apparently her mother has similar symptoms currently at home but not as bad.  Past Medical History:  Diagnosis Date  . Coronary artery disease   . Hypertension     Past Surgical History:  Procedure Laterality Date  . APPENDECTOMY      History reviewed. No pertinent family history.  Social History:  reports that she has quit smoking. She has never used smokeless tobacco. She reports current alcohol use of about 2.0 standard drinks of alcohol per week. She reports that she does not use drugs.  Allergies:  Allergies  Allergen Reactions  . Dilaudid [Hydromorphone Hcl] Other (See Comments)    Pt states she coded after dilaudid administration    Medications: Reviewed  Results for orders placed or performed during the hospital encounter of 01/11/19 (from the past 48 hour(s))  CBC with Differential/Platelet     Status: Abnormal   Collection Time: 01/11/19  8:49 AM  Result Value Ref Range   WBC 18.4 (H) 4.0 - 10.5 K/uL   RBC 4.35 3.87 - 5.11 MIL/uL   Hemoglobin 13.8 12.0 - 15.0 g/dL   HCT 19.6 22.2 - 97.9 %   MCV 95.6 80.0 - 100.0 fL   MCH 31.7 26.0 - 34.0 pg   MCHC 33.2 30.0 - 36.0 g/dL   RDW 89.2 11.9 - 41.7 %   Platelets 242 150 - 400 K/uL   nRBC 0.0 0.0 - 0.2 %   Neutrophils Relative % 89 %   Neutro Abs 16.6 (H) 1.7 - 7.7 K/uL   Lymphocytes Relative 3 %   Lymphs Abs 0.5 (L) 0.7 - 4.0 K/uL   Monocytes Relative 7 %   Monocytes Absolute 1.2 (H) 0.1 - 1.0 K/uL   Eosinophils Relative 0 %   Eosinophils Absolute 0.0 0.0 - 0.5 K/uL   Basophils Relative 0 %   Basophils Absolute 0.1 0.0 - 0.1 K/uL   Immature Granulocytes 1 %   Abs Immature Granulocytes 0.09  (H) 0.00 - 0.07 K/uL    Comment: Performed at [facility], [address] [place], [place] [address]  Comprehensive metabolic panel     Status: Abnormal   Collection Time: 01/11/19  8:49 AM  Result Value Ref Range   Sodium 137 135 - 145 mmol/L   Potassium 4.0 3.5 - 5.1 mmol/L   Chloride 101 98 - 111 mmol/L   CO2 25 22 - 32 mmol/L   Glucose, Bld 130 (H) 70 - 99 mg/dL   BUN 15 8 - 23 mg/dL   Creatinine, Ser 4.81 0.44 - 1.00 mg/dL   Calcium 9.0 8.9 - 85.6 mg/dL   Total Protein 6.8 6.5 - 8.1 g/dL   Albumin 4.2 3.5 - 5.0 g/dL   AST 25 15 - 41 U/L   ALT 25 0 - 44 U/L   Alkaline Phosphatase 75 38 - 126 U/L   Total Bilirubin 1.3 (H) 0.3 - 1.2 mg/dL   GFR calc non Af Amer >60 >60 mL/min   GFR calc Af Amer >60 >60 mL/min   Anion gap 11 5 - 15    Comment: Performed at [facility], [address] [place], [place]  [address]  Influenza panel by PCR (type A & B)     Status: None   Collection Time: 01/11/19  8:49 AM  Result Value Ref Range   Influenza A By PCR NEGATIVE NEGATIVE   Influenza B By PCR NEGATIVE NEGATIVE    Comment: (NOTE) The Xpert Xpress Flu assay is intended as an aid in the diagnosis of  influenza and should not be used as a sole basis for treatment.  This  assay is FDA approved for nasopharyngeal swab specimens only. Nasal  washings and aspirates are unacceptable for Xpert Xpress Flu testing. Performed at [facility], [address] [place], [place] [address]     Dg Chest 2 View  Result Date: 01/11/2019 CLINICAL DATA:  63 year old female with fever, and sore throat for 1 day EXAM: CHEST - 2 VIEW COMPARISON:  Prior chest x-ray 05/31/2017, 10/24/2015 FINDINGS: The lungs are clear and negative for focal airspace consolidation, pulmonary edema or suspicious pulmonary nodule. A nodular opacity projecting over the left lung base is only visible on the frontal view and may be within the breast tissue. This nodule is visible and unchanged across  multiple prior studies dating back to 2016. No pleural effusion or pneumothorax. Cardiac and mediastinal contours are within normal limits. Atherosclerotic calcifications are visible in the transverse aorta. No acute fracture or lytic or blastic osseous lesions. The visualized upper abdominal bowel gas pattern is unremarkable. IMPRESSION: No active cardiopulmonary disease. Electronically Signed   By: Malachy Moan M.D.   On: 01/11/2019 08:57   Ct Soft Tissue Neck W Contrast  Result Date: 01/11/2019 CLINICAL DATA:  Sore throat, stridor and fever beginning yesterday. Assess for epiglottitis or tonsillitis. EXAM: CT NECK WITH CONTRAST TECHNIQUE: Multidetector CT imaging of the neck was performed using the standard protocol following the bolus administration of intravenous contrast. CONTRAST:  OMNIPAQUE IOHEXOL 300 MG/ML  SOLN COMPARISON:  None. FINDINGS: Pharynx and larynx: The oropharynx and posterior nasopharynx appear normal. Tonsillar regions appear normal. There is pronounced inflammatory edema of the mucosal and submucosal structures in the supraglottic region. This includes some thickening of the epiglottis. Infraglottic trachea appears widely patent. Salivary glands: Normal Thyroid: Normal Lymph nodes: No enlarged or low-density nodes on either side of the neck. Normal bilateral nodes. Vascular: Atherosclerotic calcification at the carotid bifurcations. Limited intracranial: Normal Visualized orbits: Normal Mastoids and visualized paranasal sinuses: Clear Skeleton: Ordinary mid cervical spondylosis. Upper chest: Mild pleural and parenchymal scarring at the apices. Other: None IMPRESSION: Mucosal and submucosal inflammatory edema of the epiglottis and supraglottic airway. Narrowing of the supraglottic airway with the lumen as small as 6 mm. Electronically Signed   By: Paulina Fusi M.D.   On: 01/11/2019 09:53    EUM:PNTIRWER except as listed in admit H&P  Blood pressure 119/63, pulse 99,  temperature 99.4 F (37.4 C), temperature source Oral, resp. rate (!) 23, height 5\' 2"  (1.575 m), weight 75.3 kg, SpO2 99 %.  PHYSICAL EXAM: Overall appearance: Frequent coughing, in no airway distress or stridor but in obvious discomfort. Head:  Normocephalic, atraumatic. Ears: External ears look healthy. Nose: External nose is healthy in appearance. Internal nasal exam free of any lesions or obstruction. Oral Cavity/Pharynx:  There are no mucosal lesions or masses identified. Larynx/Hypopharynx: See below Neuro:  No identifiable neurologic deficits. Neck: No palpable neck masses.  Studies Reviewed: CT reviewed  Procedures: Flexible fiberoptic laryngoscopy.  Topical Xylocaine/Afrin was sprayed into the nasal cavities.  The scope was passed easily through the  right nasal cavity.  The nasopharynx and oropharynx were clear and healthy.  Examination of the larynx reveals diffuse edema of the supraglottic structures including the arytenoids, false cords and epiglottis.  Airway is moderately diminished.  She does have some pooling of secretions as well.  There are no mucosal masses identified.   Assessment/Plan: Acute, adult, epiglottitis.  Currently no stridor.  No airway distress currently but certainly at risk.  Recommend admission to the intensive care unit for close monitoring.  Recommend treatment with antibiotics, steroids, PPI therapy, antihistamine, and continued observation.  If her airway deteriorates then airway intervention would be necessary which could include either urgent intubation or tracheostomy.  All of this was discussed with her and her husband.  Serena Colonel 01/11/2019, 1:17 PM

## 2019-01-11 NOTE — Progress Notes (Signed)
Patient ID: Olivia Lawrence, female   DOB: 1956-10-13, 63 y.o.   MRN: [id number]   Follow-up visit, she was up using the restroom and is now back in bed.  She is feeling a little bit better and sounds a little bit better her voice is stronger.  No stridor noticed.  Stable, continue care.

## 2019-01-11 NOTE — ED Notes (Signed)
ENT at bedside

## 2019-01-12 DIAGNOSIS — J0510 Acute epiglottitis without obstruction: Secondary | ICD-10-CM | POA: Diagnosis present

## 2019-01-12 LAB — MAGNESIUM: Magnesium: 2 mg/dL (ref 1.7–2.4)

## 2019-01-12 LAB — BASIC METABOLIC PANEL
ANION GAP: 9 (ref 5–15)
BUN: 19 mg/dL (ref 8–23)
CALCIUM: 8.8 mg/dL — AB (ref 8.9–10.3)
CO2: 22 mmol/L (ref 22–32)
Chloride: 108 mmol/L (ref 98–111)
Creatinine, Ser: 0.76 mg/dL (ref 0.44–1.00)
GFR calc Af Amer: >60 mL/min (ref >60)
Glucose, Bld: 137 mg/dL — ABNORMAL HIGH (ref 70–99)
POTASSIUM: 3.5 mmol/L (ref 3.5–5.1)
Sodium: 139 mmol/L (ref 135–145)

## 2019-01-12 LAB — CBC
HCT: 39.7 % (ref 36.0–46.0)
Hemoglobin: 12.9 g/dL (ref 12.0–15.0)
MCH: 30.6 pg (ref 26.0–34.0)
MCHC: 32.5 g/dL (ref 30.0–36.0)
MCV: 94.3 fL (ref 80.0–100.0)
Platelets: 233 10*3/uL (ref 150–400)
RBC: 4.21 MIL/uL (ref 3.87–5.11)
RDW: 13.2 % (ref 11.5–15.5)
WBC: 18.4 10*3/uL — ABNORMAL HIGH (ref 4.0–10.5)
nRBC: 0 % (ref 0.0–0.2)

## 2019-01-12 LAB — HIV ANTIBODY (ROUTINE TESTING W REFLEX): HIV SCREEN 4TH GENERATION: NONREACTIVE

## 2019-01-12 LAB — PHOSPHORUS: Phosphorus: 3.8 mg/dL (ref 2.5–4.6)

## 2019-01-12 MED ORDER — AMOXICILLIN-POT CLAVULANATE 875-125 MG PO TABS
1.0000 | ORAL_TABLET | Freq: Two times a day (BID) | ORAL | Status: DC
  Administered 2019-01-12: 1 via ORAL
  Filled 2019-01-12 (×4): qty 1

## 2019-01-12 MED ORDER — AMOXICILLIN-POT CLAVULANATE 875-125 MG PO TABS: 1.0000 | ORAL_TABLET | Freq: Two times a day (BID) | ORAL | 0 refills | Status: DC

## 2019-01-12 MED ORDER — MAGIC MOUTHWASH W/LIDOCAINE
5.0000 mL | Freq: Three times a day (TID) | ORAL | Status: DC | PRN
  Administered 2019-01-12: 5 mL via ORAL
  Filled 2019-01-12 (×2): qty 5

## 2019-01-12 MED ORDER — PREDNISONE 10 MG PO TABS: 20.0000 mg | ORAL_TABLET | Freq: Every day | ORAL | 0 refills | Status: AC

## 2019-01-12 MED ORDER — LORATADINE 10 MG PO TABS
10.0000 mg | ORAL_TABLET | Freq: Every day | ORAL | Status: DC
  Administered 2019-01-12: 10 mg via ORAL
  Filled 2019-01-12: qty 1

## 2019-01-12 MED ORDER — GUAIFENESIN ER 600 MG PO TB12
1200.0000 mg | ORAL_TABLET | Freq: Two times a day (BID) | ORAL | Status: DC
  Administered 2019-01-12: 1200 mg via ORAL
  Filled 2019-01-12: qty 2

## 2019-01-12 NOTE — Progress Notes (Signed)
[facility]  S: feels OK, has sore throat  O: Vitals:   01/12/19 0700 01/12/19 0800 01/12/19 0900 01/12/19 1000  BP: 131/60 130/63 130/61 140/73  Pulse: 66 72 68 81  Resp: 18 20 20  (!) 21  Temp:  98.2 F (36.8 C)    TempSrc:  Oral    SpO2: 98% 97% 91% 97%  Weight:      Height:       RA  General:  Resting comfortably in bed, cough, no stridor HENT: NCAT OP clear PULM: CTA B, normal effort CV: RRR, no mgr GI: BS+, soft, nontender MSK: normal bulk and tone Neuro: awake, alert, no distress, MAEW  CT neck: epiglottis edema  ENT records reviewed: improved today, feel we could downgrade care  Impression: Epiglottitis: probable H flu or strep pneumo, no stridor, in no distress, airway OK  Plan: Change from Unasyn to Augmentin Add magic mouthwash with lidocaine D/c home?  [place], MD [facility] Pager: [phone] Cell: [phone] If no response, call [phone]

## 2019-01-12 NOTE — Discharge Summary (Signed)
Physician Discharge Summary   Patient ID: Olivia Lawrence MRN: [id number] DOB/AGE: 1955-11-13 63 y.o.  Admit date: 01/11/2019 Discharge date: 01/12/2019                     Discharge Plan by Diagnosis   Epiglottitis - likely due to Haemophilus influenza or streptococcus pneumoniae. - Continue empiric augmentin x 7 days. - Continue prednisone x 3 days. - Continue preadmission ranitidine. - F/u with ENT (Dr. Constance Holster) in 1 week.  NOTE:  Please call the office to schedule an appointment (number listed below under "follow up information").  Hx HTN, CAD. - Continue preadmission amlodipine, aspirin, carvedilol, lisinopril. - F/u with cardiologist in [facility].  Hx PAF. - Continue preadmission flecainide. - F/u with cardiologist in [facility].  Hx hypothyroidism. - Continue preadmission levothyroxine.   Discharge Summary  Olivia Lawrence is a 63 y.o. y/o female with a PMH of HTN and CAD and who is an Chief Technology Officer presented to [facility] ED 01/11/19 with sore throat for 7 days and low-grade fever of 100 from 1 day.  She had nausea and vomiting in route and difficulty clearing her throat.  She was seen in ambulatory clinic on 3/15 and strep throat was negative.  In ED, there was some concern about deep space infection.  She did not have any sick contacts or any risk factors for COVID-19.  CT neck was obtained and showed inflammatory edema of the epiglottis and supraglottic airway with narrowing as small as 11m.  [facility] - Dr. RConstance Holster  Flexible fiberoptic laryngoscopy was performed and showed diffuse edema of arytenoids, false cords and epiglottis with some pooling of secretions.  It was recommended that she be admitted to the ICU and started on unasyn, solumedrol, PPI, antihistamine.    On 3/17, she had significant improvement and no difficulties swallowing.  She was seen by Dr. RConstance Holsterwho felt that it was safe for her to discharge home.   She was transitioned to prednisone for 3 days and augmentin for 7 days abx therapy.  She has been instructed to follow up with Dr. RConstance Holsterin 1 week.    Of note, she was also informed that she will need to call Dr. RJaneice Robinsonoffice to schedule an appointment.  I attempted to schedule one for her; however, I did not get an answer despite multiple attempts at calling the office.         Significant Hospital Events   3/16 > admit. 3/17 > discharge home.  Significant Diagnostic Studies  CT soft tissue neck 3/16 > mucosal and submucosal inflammatory edema of the epiglottis and supraglottic airway.  Narrowing of the supraglottic airway with the lumen as small as 652m Flex laryngoscopy 3/16 > diffuse edema of arytenoids, false cords and epiglottis with some pooling of secretions.  Micro Data  None.  Antimicrobials  Unasyn 3/16 > 3/17 Augmentin 3/17 > 7 days planned.  Consults:  ENT.  Procedures (surgical and bedside):  None.  Objective:  Blood pressure 127/60, pulse 66, temperature 98.2 F (36.8 C), temperature source Oral, resp. rate 14, height _0  (1.575 m), weight 76.6 kg, SpO2 96 %.        Intake/Output Summary (Last 24 hours) at 01/12/2019 1452 Last data filed at 01/12/2019 1300 Gross per 24 hour  Intake 1926.71 ml  Output -  Net 1926.71 ml   Filed Weights   01/11/19 0800 01/12/19 0500  Weight:  75.3 kg 76.6 kg    Physical Examination: General: Adult female, sitting up in bed eating lunch, in NAD. Neuro: A&O x 3, non-focal.  HEENT: [place]/AT. EOMI, sclerae anicteric. Cardiovascular: RRR, no M/R/G.  Lungs: Respirations even and unlabored.  CTA bilaterally, No W/R/R. Abdomen: BS x 4, soft, NT/ND.  Musculoskeletal: No gross deformities, no edema.  Skin: Intact, warm, no rashes.   Discharge Labs:  BMET Recent Labs  Lab 01/11/19 0849 01/11/19 1516 01/11/19 2100 01/12/19 0619  NA 137  --  138 139  K 4.0  --  3.7 3.5  CL 101  --  107 108  CO2 25  --  22 22  GLUCOSE  130*  --  156* 137*  BUN 15  --  14 19  CREATININE 0.83 0.93 0.84 0.76  CALCIUM 9.0  --  8.8* 8.8*  MG  --   --  1.9 2.0  PHOS  --   --   --  3.8    CBC Recent Labs  Lab 01/11/19 0849 01/11/19 1516 01/12/19 0619  HGB 13.8 13.3 12.9  HCT 41.6 39.3 39.7  WBC 18.4* 22.9* 18.4*  PLT 242 261 233    Anti-Coagulation No results for input(s): INR in the last 168 hours.  Discharge Instructions    Diet - low sodium heart healthy   Complete by:  As directed    Increase activity slowly   Complete by:  As directed        Follow-up Information    Izora Gala, MD. Call today.   Specialty:  Otolaryngology Why:  Please call the office to schedule a follow up appointment in 1 week Contact information: [address] [place] [address] [phone]            Allergies as of 01/12/2019      Reactions   Dilaudid [hydromorphone Hcl] Other (See Comments)   Pt states she coded after dilaudid administration      Medication List    TAKE these medications   amLODipine 5 MG tablet Commonly known as:  NORVASC Take 5 mg by mouth daily.   amoxicillin-clavulanate 875-125 MG tablet Commonly known as:  AUGMENTIN Take 1 tablet by mouth every 12 (twelve) hours.   aspirin EC 81 MG tablet Take 81 mg by mouth daily.   carvedilol 12.5 MG tablet Commonly known as:  COREG Take 12.5 mg by mouth 2 (two) times daily with a meal.   escitalopram 10 MG tablet Commonly known as:  LEXAPRO Take 10 mg by mouth daily.   flecainide 50 MG tablet Commonly known as:  TAMBOCOR Take 50 mg by mouth 2 (two) times daily.   levothyroxine 88 MCG tablet Commonly known as:  SYNTHROID, LEVOTHROID Take 88 mcg by mouth daily before breakfast.   lisinopril 40 MG tablet Commonly known as:  PRINIVIL,ZESTRIL Take 40 mg by mouth daily.   meloxicam 15 MG tablet Commonly known as:  MOBIC Take 15 mg by mouth daily.   nitroGLYCERIN 0.4 MG SL tablet Commonly known as:   NITROSTAT Place 0.4 mg under the tongue every 5 (five) minutes as needed for chest pain.   omeprazole 40 MG capsule Commonly known as:  PRILOSEC Take 40 mg by mouth daily.   predniSONE 10 MG tablet Commonly known as:  DELTASONE Take 2 tablets (20 mg total) by mouth daily for 3 days.   ranitidine 150 MG tablet Commonly known as:  ZANTAC Take 150 mg by mouth 2 (two) times daily.  Disposition: Home.   Discharge Condition:  Disney Ruggiero has met maximum benefit of inpatient care and is medically stable and cleared for discharge.  Patient is pending follow up as above.    Time spent on discharge: Greater than 35 minutes.    Montey Hora, [facility] Pager: [phone].  If no answer, [phone] 01/12/2019, 2:52 PM

## 2019-01-12 NOTE — Progress Notes (Signed)
Patient ID: Olivia Lawrence, female   DOB: Sep 23, 1956, 63 y.o.   MRN: [id number] Subjective: Had a good night, no issues with her airway.  She is swallowing secretions much better.  Objective: Vital signs in last 24 hours: Temp:  [97.9 F (36.6 C)-100.1 F (37.8 C)] 98.2 F (36.8 C) (03/17 0400) Pulse Rate:  [62-128] 66 (03/17 0700) Resp:  [12-24] 18 (03/17 0700) BP: (103-136)/(45-83) 131/60 (03/17 0700) SpO2:  [88 %-99 %] 98 % (03/17 0700) Weight:  [76.6 kg] 76.6 kg (03/17 0500) Weight change:     Intake/Output from previous day: 03/16 0701 - 03/17 0700 In: 2577.3 [I.V.:1176.5; IV Piggyback:1400.8] Out: -  Intake/Output this shift: No intake/output data recorded.  PHYSICAL EXAM: She is awake and alert, breathing very nicely, without any stridor.  no neck swelling.  Lab Results: Recent Labs    01/11/19 1516 01/12/19 0619  WBC 22.9* 18.4*  HGB 13.3 12.9  HCT 39.3 39.7  PLT 261 233   BMET Recent Labs    01/11/19 2100 01/12/19 0619  NA 138 139  K 3.7 3.5  CL 107 108  CO2 22 22  GLUCOSE 156* 137*  BUN 14 19  CREATININE 0.84 0.76  CALCIUM 8.8* 8.8*    Studies/Results: Dg Chest 2 View  Result Date: 01/11/2019 CLINICAL DATA:  63 year old female with fever, and sore throat for 1 day EXAM: CHEST - 2 VIEW COMPARISON:  Prior chest x-ray 05/31/2017, 10/24/2015 FINDINGS: The lungs are clear and negative for focal airspace consolidation, pulmonary edema or suspicious pulmonary nodule. A nodular opacity projecting over the left lung base is only visible on the frontal view and may be within the breast tissue. This nodule is visible and unchanged across multiple prior studies dating back to 2016. No pleural effusion or pneumothorax. Cardiac and mediastinal contours are within normal limits. Atherosclerotic calcifications are visible in the transverse aorta. No acute fracture or lytic or blastic osseous lesions. The visualized upper abdominal bowel gas pattern is unremarkable.  IMPRESSION: No active cardiopulmonary disease. Electronically Signed   By: Malachy Moan M.D.   On: 01/11/2019 08:57   Ct Soft Tissue Neck W Contrast  Result Date: 01/11/2019 CLINICAL DATA:  Sore throat, stridor and fever beginning yesterday. Assess for epiglottitis or tonsillitis. EXAM: CT NECK WITH CONTRAST TECHNIQUE: Multidetector CT imaging of the neck was performed using the standard protocol following the bolus administration of intravenous contrast. CONTRAST:  OMNIPAQUE IOHEXOL 300 MG/ML  SOLN COMPARISON:  None. FINDINGS: Pharynx and larynx: The oropharynx and posterior nasopharynx appear normal. Tonsillar regions appear normal. There is pronounced inflammatory edema of the mucosal and submucosal structures in the supraglottic region. This includes some thickening of the epiglottis. Infraglottic trachea appears widely patent. Salivary glands: Normal Thyroid: Normal Lymph nodes: No enlarged or low-density nodes on either side of the neck. Normal bilateral nodes. Vascular: Atherosclerotic calcification at the carotid bifurcations. Limited intracranial: Normal Visualized orbits: Normal Mastoids and visualized paranasal sinuses: Clear Skeleton: Ordinary mid cervical spondylosis. Upper chest: Mild pleural and parenchymal scarring at the apices. Other: None IMPRESSION: Mucosal and submucosal inflammatory edema of the epiglottis and supraglottic airway. Narrowing of the supraglottic airway with the lumen as small as 6 mm. Electronically Signed   By: Paulina Fusi M.D.   On: 01/11/2019 09:53    Medications: I have reviewed the patient's current medications.  Assessment/Plan: Adult epiglottitis, progressing nicely on intravenous medications.  She is out of airway danger at this point.  She may be transferred out of ICU.  Transition to oral meds including antibiotics and oral steroids.  Discharge home when appropriate.  I will follow-up as an outpatient in 1 week.  LOS: 1 day   Serena Colonel 01/12/2019, 8:50 AM

## 2019-01-13 ENCOUNTER — Telehealth: Payer: Self-pay

## 2019-01-13 NOTE — Telephone Encounter (Signed)
Called and spoke with patient, she was in the hospital on march 16th and 17th. She was seen by BQ. She would like to have a work note for these days. BQ please advise, thank you.

## 2019-01-14 NOTE — Telephone Encounter (Signed)
Yes  To Whom It May Concern: Olivia Lawrence was admitted to [facility] from March 16-17 for respiratory distress.  She required hospitalization and ICU monitoring.  She should be excused from work for these days as well as for March 18 and 19.  Heber [place], MD [place] [facility] Pager: [phone] If no response, call [phone]

## 2019-01-14 NOTE — Telephone Encounter (Signed)
Called and spoke with patient regarding to her letter request below Advised patient that the letter is sent to her mychart Pt expressed and verbalized understanding of the letter Nothing further needed.

## 2019-09-29 ENCOUNTER — Other Ambulatory Visit: Payer: Self-pay

## 2019-09-29 ENCOUNTER — Emergency Department (HOSPITAL_BASED_OUTPATIENT_CLINIC_OR_DEPARTMENT_OTHER)
Admission: EM | Admit: 2019-09-29 | Discharge: 2019-09-29 | Disposition: A | Discharge status: Home / Self Care | Payer: BC Managed Care – PPO | Attending: Emergency Medicine | Admitting: Emergency Medicine

## 2019-09-29 ENCOUNTER — Encounter (HOSPITAL_BASED_OUTPATIENT_CLINIC_OR_DEPARTMENT_OTHER): Payer: Self-pay | Admitting: *Deleted

## 2019-09-29 ENCOUNTER — Emergency Department (HOSPITAL_BASED_OUTPATIENT_CLINIC_OR_DEPARTMENT_OTHER): Payer: BC Managed Care – PPO

## 2019-09-29 DIAGNOSIS — R058 Other specified cough: Secondary | ICD-10-CM

## 2019-09-29 DIAGNOSIS — I10 Essential (primary) hypertension: Secondary | ICD-10-CM | POA: Insufficient documentation

## 2019-09-29 DIAGNOSIS — Z885 Allergy status to narcotic agent status: Secondary | ICD-10-CM | POA: Diagnosis not present

## 2019-09-29 DIAGNOSIS — R05 Cough: Secondary | ICD-10-CM

## 2019-09-29 DIAGNOSIS — Z87891 Personal history of nicotine dependence: Secondary | ICD-10-CM | POA: Insufficient documentation

## 2019-09-29 DIAGNOSIS — I2510 Atherosclerotic heart disease of native coronary artery without angina pectoris: Secondary | ICD-10-CM | POA: Diagnosis not present

## 2019-09-29 DIAGNOSIS — U071 COVID-19: Secondary | ICD-10-CM | POA: Insufficient documentation

## 2019-09-29 DIAGNOSIS — R509 Fever, unspecified: Secondary | ICD-10-CM | POA: Diagnosis present

## 2019-09-29 NOTE — Discharge Instructions (Signed)
Person Under Monitoring Name: Olivia Lawrence  Location: [address] [place] [address]   Infection Prevention Recommendations for Individuals Confirmed to have, or Being Evaluated for, 2019 Novel Coronavirus (COVID-19) Infection Who Receive Care at Home  Individuals who are confirmed to have, or are being evaluated for, COVID-19 should follow the prevention steps below until a healthcare provider or local or state health department says they can return to normal activities.  Stay home except to get medical care You should restrict activities outside your home, except for getting medical care. Do not go to work, school, or public areas, and do not use public transportation or taxis.  Call ahead before visiting your doctor Before your medical appointment, call the healthcare provider and tell them that you have, or are being evaluated for, COVID-19 infection. This will help the healthcare providers office take steps to keep other people from getting infected. Ask your healthcare provider to call the local or state health department.  Monitor your symptoms Seek prompt medical attention if your illness is worsening (e.g., difficulty breathing). Before going to your medical appointment, call the healthcare provider and tell them that you have, or are being evaluated for, COVID-19 infection. Ask your healthcare provider to call the local or state health department.  Wear a facemask You should wear a facemask that covers your nose and mouth when you are in the same room with other people and when you visit a healthcare provider. People who live with or visit you should also wear a facemask while they are in the same room with you.  Separate yourself from other people in your home As much as possible, you should stay in a different room from other people in your home. Also, you should use a separate bathroom, if available.  Avoid sharing household items You should not share  dishes, drinking glasses, cups, eating utensils, towels, bedding, or other items with other people in your home. After using these items, you should wash them thoroughly with soap and water.  Cover your coughs and sneezes Cover your mouth and nose with a tissue when you cough or sneeze, or you can cough or sneeze into your sleeve. Throw used tissues in a lined trash can, and immediately wash your hands with soap and water for at least 20 seconds or use an alcohol-based hand rub.  Wash your [facility] your hands often and thoroughly with soap and water for at least 20 seconds. You can use an alcohol-based hand sanitizer if soap and water are not available and if your hands are not visibly dirty. Avoid touching your eyes, nose, and mouth with unwashed hands.   Prevention Steps for Caregivers and Household Members of Individuals Confirmed to have, or Being Evaluated for, COVID-19 Infection Being Cared for in the Home  If you live with, or provide care at home for, a person confirmed to have, or being evaluated for, COVID-19 infection please follow these guidelines to prevent infection:  Follow healthcare providers instructions Make sure that you understand and can help the patient follow any healthcare provider instructions for all care.  Provide for the patients basic needs You should help the patient with basic needs in the home and provide support for getting groceries, prescriptions, and other personal needs.  Monitor the patients symptoms If they are getting sicker, call his or her medical provider and tell them that the patient has, or is being evaluated for, COVID-19 infection. This will help the healthcare providers office  take steps to keep other people from getting infected. °Ask the healthcare provider to call the local or state health department. ° °Limit the number of people who have contact with the patient °If possible, have only one caregiver for the patient. °Other  household members should stay in another home or place of residence. If this is not possible, they should stay °in another room, or be separated from the patient as much as possible. Use a separate bathroom, if available. °Restrict visitors who do not have an essential need to be in the home. ° °Keep older adults, very young children, and other sick people away from the patient °Keep older adults, very young children, and those who have compromised immune systems or chronic health conditions away from the patient. This includes people with chronic heart, lung, or kidney conditions, diabetes, and cancer. ° °Ensure good ventilation °Make sure that shared spaces in the home have good air flow, such as from an air conditioner or an opened window, °weather permitting. ° °Wash your hands often °Wash your hands often and thoroughly with soap and water for at least 20 seconds. You can use an alcohol based hand sanitizer if soap and water are not available and if your hands are not visibly dirty. °Avoid touching your eyes, nose, and mouth with unwashed hands. °Use disposable paper towels to dry your hands. If not available, use dedicated cloth towels and replace them when they become wet. ° °Wear a facemask and gloves °Wear a disposable facemask at all times in the room and gloves when you touch or have contact with the patient’s blood, body fluids, and/or secretions or excretions, such as sweat, saliva, sputum, nasal mucus, vomit, urine, or feces.  Ensure the mask fits over your nose and mouth tightly, and do not touch it during use. °Throw out disposable facemasks and gloves after using them. Do not reuse. °Wash your hands immediately after removing your facemask and gloves. °If your personal clothing becomes contaminated, carefully remove clothing and launder. Wash your hands after handling contaminated clothing. °Place all used disposable facemasks, gloves, and other waste in a lined container before disposing them with  other household waste. °Remove gloves and wash your hands immediately after handling these items. ° °Do not share dishes, glasses, or other household items with the patient °Avoid sharing household items. You should not share dishes, drinking glasses, cups, eating utensils, towels, bedding, or other items with a patient who is confirmed to have, or being evaluated for, COVID-19 infection. °After the person uses these items, you should wash them thoroughly with soap and water. ° °Wash laundry thoroughly °Immediately remove and wash clothes or bedding that have blood, body fluids, and/or secretions or excretions, such as sweat, saliva, sputum, nasal mucus, vomit, urine, or feces, on them. °Wear gloves when handling laundry from the patient. °Read and follow directions on labels of laundry or clothing items and detergent. In general, wash and dry with the warmest temperatures recommended on the label. ° °Clean all areas the individual has used often °Clean all touchable surfaces, such as counters, tabletops, doorknobs, bathroom fixtures, toilets, phones, keyboards, tablets, and bedside tables, every day. Also, clean any surfaces that may have blood, body fluids, and/or secretions or excretions on them. °Wear gloves when cleaning surfaces the patient has come in contact with. °Use a diluted bleach solution (e.g., dilute bleach with 1 part bleach and 10 parts water) or a household disinfectant with a label that says EPA-registered for coronaviruses. To make a bleach   solution at home, add 1 tablespoon of bleach to 1 quart (4 cups) of water. For a larger supply, add  cup of bleach to 1 gallon (16 cups) of water. Read labels of cleaning products and follow recommendations provided on product labels. Labels contain instructions for safe and effective use of the cleaning product including precautions you should take when applying the product, such as wearing gloves or eye protection and making sure you have good ventilation  during use of the product. Remove gloves and wash hands immediately after cleaning.  Monitor yourself for signs and symptoms of illness Caregivers and household members are considered close contacts, should monitor their health, and will be asked to limit movement outside of the home to the extent possible. Follow the monitoring steps for close contacts listed on the symptom monitoring form.   ? If you have additional questions, contact your local health department or call the epidemiologist on call at [phone] (available 24/7). ? This guidance is subject to change. For the most up-to-date guidance from [facility], please refer to their website: YouBlogs.pl

## 2019-09-29 NOTE — ED Triage Notes (Signed)
Pt c/o cough, fever x 4 days. PTA motrin at 1330

## 2019-09-29 NOTE — ED Notes (Signed)
97% 02 while ambulating in room

## 2019-09-29 NOTE — ED Provider Notes (Signed)
Emergency Department Provider Note   I have reviewed the triage vital signs and the nursing notes.   HISTORY  Chief Complaint Fever   HPI Olivia Lawrence is a 63 y.o. female with PMH of CAD and HTN presents to the emergency department for evaluation of cough, congestion, fever.  Symptoms of been ongoing for the past 4 days.  Patient is here with her elderly mother who is having similar symptoms.  Patient denies diarrhea or shortness of breath.  She is not having chest pain.  She has had no known COVID-19 contact. No abdominal pain. No radiation of symptoms or modifying factors.   Past Medical History:  Diagnosis Date  . Coronary artery disease   . Hypertension     Patient Active Problem List   Diagnosis Date Noted  . Epiglottiditis 01/12/2019  . Epiglottitis 01/11/2019    Past Surgical History:  Procedure Laterality Date  . APPENDECTOMY      Allergies Dilaudid [hydromorphone hcl]  No family history on file.  Social History Social History   Tobacco Use  . Smoking status: Former Research scientist (life sciences)  . Smokeless tobacco: Never Used  Substance Use Topics  . Alcohol use: Yes    Alcohol/week: 2.0 standard drinks    Types: 2 Glasses of wine per week  . Drug use: Never    Review of Systems  Constitutional: Positive fever/chills Eyes: No visual changes. ENT: No sore throat. Positive congestion.  Cardiovascular: Denies chest pain. Respiratory: Denies shortness of breath. Positive cough.  Gastrointestinal: No abdominal pain.  No nausea, no vomiting.  No diarrhea.  No constipation. Genitourinary: Negative for dysuria. Musculoskeletal: Negative for back pain. Skin: Negative for rash. Neurological: Negative for headaches, focal weakness or numbness.  10-point ROS otherwise negative.  ____________________________________________   PHYSICAL EXAM:  VITAL SIGNS: ED Triage Vitals  Enc Vitals Group     BP 09/29/19 1658 (!) 150/103     Pulse Rate 09/29/19 1658 84     Resp  09/29/19 1658 16     Temp 09/29/19 1658 98.9 F (37.2 C)     Temp Source 09/29/19 1658 Oral     SpO2 09/29/19 1658 98 %     Weight 09/29/19 1659 173 lb (78.5 kg)     Height 09/29/19 1659 5\' 3"  (1.6 m)   Constitutional: Alert and oriented. Well appearing and in no acute distress. Eyes: Conjunctivae are normal.  Head: Atraumatic. Nose: Mild congestion/rhinnorhea. Mouth/Throat: Mucous membranes are moist.   Neck: No stridor.   Cardiovascular: Normal rate, regular rhythm. Good peripheral circulation. Grossly normal heart sounds.   Respiratory: Normal respiratory effort. No retractions. Lungs CTAB. Gastrointestinal: Soft and nontender. No distention.  Musculoskeletal: No lower extremity tenderness nor edema. No gross deformities of extremities. Neurologic:  Normal speech and language. No gross focal neurologic deficits are appreciated.  Skin:  Skin is warm, dry and intact. No rash noted.   ____________________________________________   LABS (all labs ordered are listed, but only abnormal results are displayed)  Labs Reviewed  NOVEL CORONAVIRUS, NAA (HOSP ORDER, SEND-OUT TO REF LAB; TAT 18-24 HRS)   ____________________________________________  RADIOLOGY  Dg Chest Portable 1 View  Result Date: 09/29/2019 CLINICAL DATA:  63 year old female with cough and fever. EXAM: PORTABLE CHEST 1 VIEW COMPARISON:  Chest radiograph dated 01/11/2019. FINDINGS: The lungs are clear. There is no pleural effusion or pneumothorax. The cardiac silhouette is within normal limits. Atherosclerotic calcification of the aorta. No acute osseous pathology. IMPRESSION: No active cardiopulmonary disease. Electronically Signed  By: Elgie Collard M.D.   On: 09/29/2019 19:41    ____________________________________________   PROCEDURES  Procedure(s) performed:   Procedures  None  ____________________________________________   INITIAL IMPRESSION / ASSESSMENT AND PLAN / ED COURSE  Pertinent labs &  imaging results that were available during my care of the patient were reviewed by me and considered in my medical decision making (see chart for details).   Patient presents to the emergency department for evaluation of Covid-like symptoms.  Relatively mild symptoms.  Plan for ambulatory pulse ox, chest x-ray, COVID-19 testing. Patient to follow results in MyChart.   Chest x-ray reviewed.  Patient's mother with similar symptoms is here and tested positive for COVID-19 on rapid test.  I will send the PCR test for this patient but assuming that she has COVID-19.  She will stay in isolation.  Discussed ED return precautions in detail.  Patient to follow-up by phone with her primary care doctor.  ____________________________________________  FINAL CLINICAL IMPRESSION(S) / ED DIAGNOSES  Final diagnoses:  Cough with exposure to COVID-19 virus    Note:  This document was prepared using Dragon voice recognition software and may include unintentional dictation errors.  Alona Bene, MD, [facility] Emergency Medicine    Long, Arlyss Repress, MD 09/29/19 [id number]

## 2019-10-04 ENCOUNTER — Telehealth: Payer: Self-pay | Admitting: *Deleted

## 2019-10-04 LAB — NOVEL CORONAVIRUS, NAA (HOSP ORDER, SEND-OUT TO REF LAB; TAT 18-24 HRS): SARS-CoV-2, NAA: DETECTED — AB

## 2019-10-04 NOTE — Telephone Encounter (Signed)
Pt tested in ED.  Pt calling for quarantine guidelines. Patient states has LGT 99.0-100.0, cough.  Reviewed quarantine precautions; self isolate for 10 days from onset of symptoms, with 3 consecutive days fever free without fever reducing medications. Any symptoms should be resolved at that time as well. Leave home for medical issues only. Treat any symptoms with over the counter medications. Reviewed household precautions and preventive care measures, including:  frequent hand-washing, wiping down of high touch areas ie: doorknobs, counter tops. Isolate, distance from rest of household. Any household members must quarantine as well for 14 days from your test date. Reviewed symptoms which warrant an ED visit. Pt verbalizes understanding.  Will alert [facility]

## 2020-07-29 ENCOUNTER — Other Ambulatory Visit: Payer: Self-pay

## 2020-07-29 ENCOUNTER — Emergency Department (HOSPITAL_BASED_OUTPATIENT_CLINIC_OR_DEPARTMENT_OTHER): Payer: BC Managed Care – PPO

## 2020-07-29 ENCOUNTER — Encounter (HOSPITAL_BASED_OUTPATIENT_CLINIC_OR_DEPARTMENT_OTHER): Payer: Self-pay

## 2020-07-29 ENCOUNTER — Observation Stay (HOSPITAL_BASED_OUTPATIENT_CLINIC_OR_DEPARTMENT_OTHER)
Admission: EM | Admit: 2020-07-29 | Discharge: 2020-07-30 | Disposition: A | Discharge status: Home / Self Care | Payer: BC Managed Care – PPO | Attending: Emergency Medicine | Admitting: Emergency Medicine

## 2020-07-29 DIAGNOSIS — F32A Depression, unspecified: Secondary | ICD-10-CM | POA: Diagnosis present

## 2020-07-29 DIAGNOSIS — Z79899 Other long term (current) drug therapy: Secondary | ICD-10-CM | POA: Insufficient documentation

## 2020-07-29 DIAGNOSIS — I10 Essential (primary) hypertension: Secondary | ICD-10-CM | POA: Diagnosis present

## 2020-07-29 DIAGNOSIS — I119 Hypertensive heart disease without heart failure: Secondary | ICD-10-CM | POA: Insufficient documentation

## 2020-07-29 DIAGNOSIS — Z87891 Personal history of nicotine dependence: Secondary | ICD-10-CM | POA: Diagnosis not present

## 2020-07-29 DIAGNOSIS — I2510 Atherosclerotic heart disease of native coronary artery without angina pectoris: Secondary | ICD-10-CM | POA: Diagnosis not present

## 2020-07-29 DIAGNOSIS — E039 Hypothyroidism, unspecified: Secondary | ICD-10-CM | POA: Diagnosis not present

## 2020-07-29 DIAGNOSIS — J029 Acute pharyngitis, unspecified: Secondary | ICD-10-CM | POA: Diagnosis present

## 2020-07-29 DIAGNOSIS — Z20822 Contact with and (suspected) exposure to covid-19: Secondary | ICD-10-CM | POA: Insufficient documentation

## 2020-07-29 DIAGNOSIS — K219 Gastro-esophageal reflux disease without esophagitis: Secondary | ICD-10-CM | POA: Diagnosis present

## 2020-07-29 DIAGNOSIS — J0510 Acute epiglottitis without obstruction: Secondary | ICD-10-CM | POA: Diagnosis not present

## 2020-07-29 DIAGNOSIS — Z7982 Long term (current) use of aspirin: Secondary | ICD-10-CM | POA: Insufficient documentation

## 2020-07-29 HISTORY — DX: COVID-19: U07.1

## 2020-07-29 LAB — COMPREHENSIVE METABOLIC PANEL
ALT: 41 U/L (ref 0–44)
AST: 43 U/L — ABNORMAL HIGH (ref 15–41)
Albumin: 3.8 g/dL (ref 3.5–5.0)
Alkaline Phosphatase: 37 U/L — ABNORMAL LOW (ref 38–126)
Anion gap: 12 (ref 5–15)
BUN: 17 mg/dL (ref 8–23)
CO2: 23 mmol/L (ref 22–32)
Calcium: 8.4 mg/dL — ABNORMAL LOW (ref 8.9–10.3)
Chloride: 102 mmol/L (ref 98–111)
Creatinine, Ser: 0.88 mg/dL (ref 0.44–1.00)
GFR calc Af Amer: >60 mL/min (ref >60)
GFR calc non Af Amer: >60 mL/min (ref >60)
Glucose, Bld: 105 mg/dL — ABNORMAL HIGH (ref 70–99)
Potassium: 4 mmol/L (ref 3.5–5.1)
Sodium: 137 mmol/L (ref 135–145)
Total Bilirubin: 0.7 mg/dL (ref 0.3–1.2)
Total Protein: 6.5 g/dL (ref 6.5–8.1)

## 2020-07-29 LAB — CREATININE, SERUM
Creatinine, Ser: 0.93 mg/dL (ref 0.44–1.00)
GFR calc Af Amer: >60 mL/min (ref >60)
GFR calc non Af Amer: >60 mL/min (ref >60)

## 2020-07-29 LAB — CBC
HCT: 37.8 % (ref 36.0–46.0)
HCT: 38.9 % (ref 36.0–46.0)
Hemoglobin: 12.5 g/dL (ref 12.0–15.0)
Hemoglobin: 12.8 g/dL (ref 12.0–15.0)
MCH: 31.1 pg (ref 26.0–34.0)
MCH: 30.8 pg (ref 26.0–34.0)
MCHC: 33.1 g/dL (ref 30.0–36.0)
MCHC: 32.9 g/dL (ref 30.0–36.0)
MCV: 94 fL (ref 80.0–100.0)
MCV: 93.5 fL (ref 80.0–100.0)
Platelets: 295 10*3/uL (ref 150–400)
Platelets: 266 10*3/uL (ref 150–400)
RBC: 4.02 MIL/uL (ref 3.87–5.11)
RBC: 4.16 MIL/uL (ref 3.87–5.11)
RDW: 13.2 % (ref 11.5–15.5)
RDW: 13 % (ref 11.5–15.5)
WBC: 2.6 10*3/uL — ABNORMAL LOW (ref 4.0–10.5)
WBC: 3.2 10*3/uL — ABNORMAL LOW (ref 4.0–10.5)
nRBC: 0 % (ref 0.0–0.2)
nRBC: 0 % (ref 0.0–0.2)

## 2020-07-29 LAB — RESPIRATORY PANEL BY RT PCR (FLU A&B, COVID)
Influenza A by PCR: NEGATIVE
Influenza B by PCR: NEGATIVE
SARS Coronavirus 2 by RT PCR: NEGATIVE

## 2020-07-29 LAB — MAGNESIUM: Magnesium: 2 mg/dL (ref 1.7–2.4)

## 2020-07-29 LAB — HIV ANTIBODY (ROUTINE TESTING W REFLEX): HIV Screen 4th Generation wRfx: NONREACTIVE

## 2020-07-29 LAB — TSH: TSH: 0.342 u[IU]/mL — ABNORMAL LOW (ref 0.350–4.500)

## 2020-07-29 MED ORDER — LISINOPRIL 40 MG PO TABS
40.0000 mg | ORAL_TABLET | Freq: Every day | ORAL | Status: DC
  Administered 2020-07-30: 40 mg via ORAL
  Filled 2020-07-29: qty 1

## 2020-07-29 MED ORDER — ESCITALOPRAM OXALATE 20 MG PO TABS
20.0000 mg | ORAL_TABLET | Freq: Every day | ORAL | Status: DC
  Administered 2020-07-29: 20 mg via ORAL
  Filled 2020-07-29: qty 1

## 2020-07-29 MED ORDER — FLECAINIDE ACETATE 50 MG PO TABS
50.0000 mg | ORAL_TABLET | Freq: Two times a day (BID) | ORAL | Status: DC
  Administered 2020-07-29 – 2020-07-30 (×2): 50 mg via ORAL
  Filled 2020-07-29 (×3): qty 1

## 2020-07-29 MED ORDER — ENOXAPARIN SODIUM 40 MG/0.4ML ~~LOC~~ SOLN
40.0000 mg | SUBCUTANEOUS | Status: DC
  Administered 2020-07-29: 40 mg via SUBCUTANEOUS
  Filled 2020-07-29: qty 0.4

## 2020-07-29 MED ORDER — ONDANSETRON HCL 4 MG/2ML IJ SOLN: 4.0000 mg | Freq: Four times a day (QID) | INTRAMUSCULAR | Status: DC | PRN

## 2020-07-29 MED ORDER — SODIUM CHLORIDE 0.9 % IV SOLN: INTRAVENOUS | Status: DC | PRN

## 2020-07-29 MED ORDER — NITROGLYCERIN 0.4 MG SL SUBL: 0.4000 mg | SUBLINGUAL_TABLET | SUBLINGUAL | Status: DC | PRN

## 2020-07-29 MED ORDER — ACETAMINOPHEN 650 MG RE SUPP: 650.0000 mg | Freq: Four times a day (QID) | RECTAL | Status: DC | PRN

## 2020-07-29 MED ORDER — ASPIRIN EC 81 MG PO TBEC
81.0000 mg | DELAYED_RELEASE_TABLET | Freq: Every day | ORAL | Status: DC
  Administered 2020-07-29: 81 mg via ORAL
  Filled 2020-07-29: qty 1

## 2020-07-29 MED ORDER — CARVEDILOL 12.5 MG PO TABS
12.5000 mg | ORAL_TABLET | Freq: Two times a day (BID) | ORAL | Status: DC
  Administered 2020-07-29 – 2020-07-30 (×2): 12.5 mg via ORAL
  Filled 2020-07-29 (×2): qty 1

## 2020-07-29 MED ORDER — METHYLPREDNISOLONE SODIUM SUCC 40 MG IJ SOLR
40.0000 mg | Freq: Two times a day (BID) | INTRAMUSCULAR | Status: DC
  Administered 2020-07-29 – 2020-07-30 (×2): 40 mg via INTRAVENOUS
  Filled 2020-07-29 (×2): qty 1

## 2020-07-29 MED ORDER — ONDANSETRON HCL 4 MG PO TABS: 4.0000 mg | ORAL_TABLET | Freq: Four times a day (QID) | ORAL | Status: DC | PRN

## 2020-07-29 MED ORDER — SODIUM CHLORIDE 0.9 % IV SOLN
3.0000 g | Freq: Once | INTRAVENOUS | Status: AC
  Administered 2020-07-29: 3 g via INTRAVENOUS
  Filled 2020-07-29: qty 8

## 2020-07-29 MED ORDER — SODIUM CHLORIDE 0.9 % IV SOLN
3.0000 g | Freq: Four times a day (QID) | INTRAVENOUS | Status: DC
  Administered 2020-07-29 – 2020-07-30 (×4): 3 g via INTRAVENOUS
  Filled 2020-07-29 (×2): qty 8
  Filled 2020-07-29: qty 3
  Filled 2020-07-29: qty 8
  Filled 2020-07-29: qty 3
  Filled 2020-07-29: qty 8

## 2020-07-29 MED ORDER — DEXAMETHASONE SODIUM PHOSPHATE 10 MG/ML IJ SOLN: 10.0000 mg | Freq: Once | INTRAMUSCULAR | Status: DC

## 2020-07-29 MED ORDER — LORAZEPAM 0.5 MG PO TABS: 0.5000 mg | ORAL_TABLET | Freq: Every day | ORAL | Status: DC | PRN

## 2020-07-29 MED ORDER — OMEPRAZOLE MAGNESIUM 20 MG PO TBEC: 20.0000 mg | DELAYED_RELEASE_TABLET | Freq: Every day | ORAL | Status: DC | PRN

## 2020-07-29 MED ORDER — LEVOTHYROXINE SODIUM 88 MCG PO TABS
88.0000 ug | ORAL_TABLET | Freq: Every day | ORAL | Status: DC
  Administered 2020-07-30: 88 ug via ORAL
  Filled 2020-07-29: qty 1

## 2020-07-29 MED ORDER — AMLODIPINE BESYLATE 5 MG PO TABS
5.0000 mg | ORAL_TABLET | Freq: Every day | ORAL | Status: DC
  Administered 2020-07-29: 5 mg via ORAL
  Filled 2020-07-29: qty 1

## 2020-07-29 MED ORDER — SODIUM CHLORIDE 0.9 % IV SOLN: INTRAVENOUS | Status: DC

## 2020-07-29 MED ORDER — PANTOPRAZOLE SODIUM 40 MG PO TBEC: 40.0000 mg | DELAYED_RELEASE_TABLET | Freq: Every day | ORAL | Status: DC | PRN

## 2020-07-29 MED ORDER — DEXAMETHASONE SODIUM PHOSPHATE 10 MG/ML IJ SOLN
10.0000 mg | Freq: Once | INTRAMUSCULAR | Status: AC
  Administered 2020-07-29: 10 mg via INTRAVENOUS
  Filled 2020-07-29: qty 1

## 2020-07-29 MED ORDER — ACETAMINOPHEN 325 MG PO TABS: 650.0000 mg | ORAL_TABLET | Freq: Four times a day (QID) | ORAL | Status: DC | PRN

## 2020-07-29 MED ORDER — IOHEXOL 300 MG/ML  SOLN
100.0000 mL | Freq: Once | INTRAMUSCULAR | Status: AC | PRN
  Administered 2020-07-29: 75 mL via INTRAVENOUS

## 2020-07-29 NOTE — H&P (Addendum)
History and Physical    Olivia Lawrence EXB:[id number] DOB: 1956/07/27 DOA: 07/29/2020  PCP: Jackalyn Lombard I, MD  Patient coming from: [facility]  I have personally briefly reviewed patient's old medical records in [facility]  Chief Complaint: Difficulty in swallowing  HPI: Olivia Lawrence is a 64 y.o. female with medical history significant of coronary artery disease, hypertension, hyperlipidemia, GERD, hypothyroidism, depression/anxiety presents to emergency department with difficulty in swallowing  Patient tells me that he started having sore throat and difficulty in swallowing and sensation of choking started yesterday evening.  She received Covid booster shot 2 days ago and initially she thought her symptoms are indeed related to Covid vaccine.  Due to worsening sore throat and change in voice she decided to go to med [facility] for further evaluation and management.  She reports generalized weakness, no energy and fatigue however denies fever, chills, headache, blurry vision, chest pain, shortness of breath, wheezing, leg swelling, cough, congestion, urinary or bowel changes.  No history of smoking, illicit drug use however drinks 2 glasses of wine every night.  She had a history of epiglottitis in the past required ICU admission.  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  She cannot quit her job-she requesting for a letter stating to "stay away from large group of people due to recurrent symptoms".  ED Course: Upon arrival to ED: Patient's vital signs stable, afebrile, maintaining oxygen saturation on room air.  CBC shows leukopenia of 2.6, COVID-19 negative.  CT soft tissue neck concerning for mucosal and submucosal edema involving the supraglottic airway, including the aryepiglottic folds and epiglottis.  There is mild associated supraglottic airway narrowing  with apparent complete effacement on the study.  Patient was given Unasyn and Decadron in ED.  EDP consulted ENT who agreed with antibiotics and medical admission.  [facility] hospitalist consulted for admission for acute epiglottitis  Review of Systems: As per HPI otherwise negative.    Past Medical History:  Diagnosis Date  . Coronary artery disease   . COVID-19   . Hypertension     Past Surgical History:  Procedure Laterality Date  . APPENDECTOMY    . CARDIAC SURGERY       reports that she has quit smoking. She has never used smokeless tobacco. She reports current alcohol use of about 14.0 standard drinks of alcohol per week. She reports that she does not use drugs.  Allergies  Allergen Reactions  . Dilaudid [Hydromorphone Hcl] Other (See Comments)    Pt states she coded after dilaudid administration  . Zetia [Ezetimibe] Rash    History reviewed. No pertinent family history.  Prior to Admission medications   Medication Sig Start Date End Date Taking? Authorizing Provider  amLODipine (NORVASC) 5 MG tablet Take 5 mg by mouth daily.   Yes [provider]  aspirin EC 81 MG tablet Take 81 mg by mouth daily.   Yes [provider]  carvedilol (COREG) 12.5 MG tablet Take 12.5 mg by mouth 2 (two) times daily with a meal.   Yes [provider]  escitalopram (LEXAPRO) 10 MG tablet Take 10 mg by mouth daily.   Yes [provider]  flecainide (TAMBOCOR) 50 MG tablet Take 50 mg by mouth 2 (two) times daily.   Yes [provider]  levothyroxine (SYNTHROID, LEVOTHROID) 88 MCG tablet Take 88 mcg by mouth  daily before breakfast.   Yes [provider]  lisinopril (PRINIVIL,ZESTRIL) 40 MG tablet Take 40 mg by mouth daily.   Yes [provider]  meloxicam (MOBIC) 15 MG tablet Take 15 mg by mouth daily.   Yes [provider]  omeprazole (PRILOSEC) 40 MG capsule Take 40 mg by mouth daily.   Yes [provider]  ranitidine  (ZANTAC) 150 MG tablet Take 150 mg by mouth 2 (two) times daily. 02/13/18  Yes [provider]  amoxicillin-clavulanate (AUGMENTIN) 875-125 MG tablet Take 1 tablet by mouth every 12 (twelve) hours. 01/12/19   Desai, Rahul P, PA-C  nitroGLYCERIN (NITROSTAT) 0.4 MG SL tablet Place 0.4 mg under the tongue every 5 (five) minutes as needed for chest pain. 06/13/17   [provider]    Physical Exam: Vitals:   07/29/20 1053 07/29/20 1230 07/29/20 1233 07/29/20 1245  BP: (!) 123/57 (!) 150/75  (!) 131/56  Pulse: 67 69  64  Resp: 18 20  20   Temp:  98.3 F (36.8 C)    TempSrc:  Oral    SpO2: 98% 96%  95%  Weight:   79.4 kg   Height:   5\' 2"  (1.575 m)     Constitutional: NAD, calm, comfortable, on room air, communicating well, no respiratory distress Eyes: PERRL, lids and conjunctivae normal ENMT: Mucous membranes are moist. Posterior pharynx clear of any exudate or lesions.Normal dentition.  Neck: normal, supple, no masses, no thyromegaly Respiratory: clear to auscultation bilaterally, no wheezing, no crackles. Normal respiratory effort. No accessory muscle use.  Cardiovascular: Regular rate and rhythm, no murmurs / rubs / gallops. No extremity edema. 2+ pedal pulses. No carotid bruits.  Abdomen: no tenderness, no masses palpated. No hepatosplenomegaly. Bowel sounds positive.  Musculoskeletal: no clubbing / cyanosis. No joint deformity upper and lower extremities. Good ROM, no contractures. Normal muscle tone.  Skin: no rashes, lesions, ulcers. No induration Neurologic: CN 2-12 grossly intact. Sensation intact, DTR normal. Strength 5/5 in all 4.  Psychiatric: Normal judgment and insight. Alert and oriented x 3. Normal mood.    Labs on Admission: I have personally reviewed following labs and imaging studies  CBC: Recent Labs  Lab 07/29/20 0911  WBC 2.6*  HGB 12.5  HCT 37.8  MCV 94.0  PLT 295   Basic Metabolic Panel: Recent Labs  Lab 07/29/20 0911  NA 137  K 4.0    CL 102  CO2 23  GLUCOSE 105*  BUN 17  CREATININE 0.88  CALCIUM 8.4*   GFR: Estimated Creatinine Clearance: 61.4 mL/min (by C-G formula based on SCr of 0.88 mg/dL). Liver Function Tests: Recent Labs  Lab 07/29/20 0911  AST 43*  ALT 41  ALKPHOS 37*  BILITOT 0.7  PROT 6.5  ALBUMIN 3.8   No results for input(s): LIPASE, AMYLASE in the last 168 hours. No results for input(s): AMMONIA in the last 168 hours. Coagulation Profile: No results for input(s): INR, PROTIME in the last 168 hours. Cardiac Enzymes: No results for input(s): CKTOTAL, CKMB, CKMBINDEX, TROPONINI in the last 168 hours. BNP (last 3 results) No results for input(s): PROBNP in the last 8760 hours. HbA1C: No results for input(s): HGBA1C in the last 72 hours. CBG: No results for input(s): GLUCAP in the last 168 hours. Lipid Profile: No results for input(s): CHOL, HDL, LDLCALC, TRIG, CHOLHDL, LDLDIRECT in the last 72 hours. Thyroid Function Tests: No results for input(s): TSH, T4TOTAL, FREET4, T3FREE, THYROIDAB in the last 72 hours. Anemia Panel: No results  for input(s): VITAMINB12, FOLATE, FERRITIN, TIBC, IRON, RETICCTPCT in the last 72 hours. Urine analysis: No results found for: COLORURINE, APPEARANCEUR, LABSPEC, PHURINE, GLUCOSEU, HGBUR, BILIRUBINUR, KETONESUR, PROTEINUR, UROBILINOGEN, NITRITE, LEUKOCYTESUR  Radiological Exams on Admission: CT Soft Tissue Neck W Contrast  Result Date: 07/29/2020 CLINICAL DATA:  Epiglottitis or tonsillitis suspected. EXAM: CT NECK WITH CONTRAST TECHNIQUE: Multidetector CT imaging of the neck was performed using the standard protocol following the bolus administration of intravenous contrast. CONTRAST:  50mL OMNIPAQUE IOHEXOL 300 MG/ML  SOLN COMPARISON:  CT neck January 11, 2019 FINDINGS: Pharynx and larynx: The oropharynx and posterior nasopharynx appear normal. There is pronounced mucosal and submucosal edema involving the supraglottic region, particularly the aryepiglottic folds  bilaterally. This includes some thickening of the epiglottis. There is resulting severe airway narrowing, which appears completely effaced in the region of the base of the epiglottis. Salivary glands: No inflammation, mass, or stone. Thyroid: Normal. Lymph nodes: None enlarged or abnormal density. Vascular: Calcific atherosclerosis.  No evidence of occlusion. Limited intracranial: Negative. Visualized orbits: Negative. Mastoids and visualized paranasal sinuses: Clear. Skeleton: Similar multilevel degenerative changes of the cervical spine, greatest at C5-C6. Upper chest: Negative. IMPRESSION: Mucosal and submucosal edema involving the supraglottic airway, including the aryepiglottic folds and epiglottis. There is marked associated supraglottic airway narrowing with apparent complete effacement on this study. Critical Value/emergent results were called by telephone at the time of interpretation on 07/29/2020 at 10:39 am to provider [facility] , who verbally acknowledged these results. Electronically Signed   By: Feliberto Harts MD   On: 07/29/2020 10:46    Assessment/Plan Principal Problem:   Epiglottitis Active Problems:   Hypertension   Hypothyroidism   GERD (gastroesophageal reflux disease)   Depression   Acute epiglottitis: -Patient is currently afebrile, maintaining oxygen saturation on room air.  Sitting comfortably on the bed.  No respiratory distress/stridor/stridor.  COVID-19 negative.  Reviewed CT soft tissue neck. -Received Unasyn and Decadron in ED. -EDP consulted ENT who recommended to continue antibiotic and steroid and call if needed. -Continue Unasyn, start patient on Solu-Medrol -Start on clear liquid diet-advance diet as tolerated. -Tylenol as needed for fever more than 100.4.  Zofran as needed for nausea and vomiting. -Monitor vitals closely. -Low threshold to transfer to ICU if any worsening respiratory symptoms.  Leukopenia: WBC 2.6. -Repeat CBC tomorrow AM.  Patient is  afebrile.  Hypertension: Stable -Continue amlodipine, Coreg, lisinopril.  Monitor blood pressure closely  Hypothyroidism: Check TSH.  Continue levothyroxine  Hyperlipidemia: Continue statin  GERD: Continue PPI  Coronary artery disease: Stable.  No ACS symptoms.  Continue aspirin, nitro as needed, Coreg  Paroxysmal A. fib: Rate controlled.  Continue flecainide  Depression/anxiety: Continue Lexapro, Ativan as needed  DVT prophylaxis: Lovenox/SCD Code Status: Full code Family Communication:None present at bedside.  Plan of care discussed with patient in length and he verbalized understanding and agreed with it. Disposition Plan: Home tomorrow Consults called: None Admission status: Inpatient,   Ollen Bowl MD [facility] Pager [phone]  If 7PM-7AM, please contact night-coverage [email or web address] Password [email or web address]  07/29/2020, 1:55 PM

## 2020-07-29 NOTE — ED Provider Notes (Signed)
[facility]PT [facility] Emergency Department Provider Note MRN:  [id number]  Arrival date & time: 07/29/20     Chief Complaint   Sore Throat   History of Present Illness   Olivia Lawrence is a 64 y.o. year-old female with a history of CAD, hypertension presenting to the ED with chief complaint of sore throat.  Sore throat and sensation of throat closing since yesterday evening.  Explains that she received the Covid booster 2 days ago, felt generally unwell yesterday.  Denies cough, no headache, no vision change, no nasal congestion, no chest pain or shortness of breath, no abdominal pain.  Symptoms are constant, moderate, no exacerbating or alleviating factors.  Review of Systems  A complete 10 system review of systems was obtained and all systems are negative except as noted in the HPI and PMH.   Patient's Health History    Past Medical History:  Diagnosis Date  . Coronary artery disease   . Hypertension     Past Surgical History:  Procedure Laterality Date  . APPENDECTOMY      History reviewed. No pertinent family history.  Social History   Socioeconomic History  . Marital status: Married    Spouse name: Not on file  . Number of children: Not on file  . Years of education: Not on file  . Highest education level: Not on file  Occupational History  . Not on file  Tobacco Use  . Smoking status: Former Games developer  . Smokeless tobacco: Never Used  Vaping Use  . Vaping Use: Never used  Substance and Sexual Activity  . Alcohol use: Yes    Alcohol/week: 2.0 standard drinks    Types: 2 Glasses of wine per week  . Drug use: Never  . Sexual activity: Not on file  Other Topics Concern  . Not on file  Social History Narrative  . Not on file   Social Determinants of Health   Financial Resource Strain:   . Difficulty of Paying Living Expenses: Not on file  Food Insecurity:   . Worried About Programme researcher, broadcasting/film/video in the Last Year: Not on file  . Ran Out of  Food in the Last Year: Not on file  Transportation Needs:   . Lack of Transportation (Medical): Not on file  . Lack of Transportation (Non-Medical): Not on file  Physical Activity:   . Days of Exercise per Week: Not on file  . Minutes of Exercise per Session: Not on file  Stress:   . Feeling of Stress : Not on file  Social Connections:   . Frequency of Communication with Friends and Family: Not on file  . Frequency of Social Gatherings with Friends and Family: Not on file  . Attends Religious Services: Not on file  . Active Member of Clubs or Organizations: Not on file  . Attends Banker Meetings: Not on file  . Marital Status: Not on file  Intimate Partner Violence:   . Fear of Current or Ex-Partner: Not on file  . Emotionally Abused: Not on file  . Physically Abused: Not on file  . Sexually Abused: Not on file     Physical Exam   Vitals:   07/29/20 0840 07/29/20 1053  BP:  (!) 123/57  Pulse:  67  Resp:  18  Temp:    SpO2: 98% 98%    CONSTITUTIONAL: Well-appearing, NAD NEURO:  Alert and oriented x 3, no focal deficits EYES:  eyes equal and reactive ENT/NECK:  no  LAD, no JVD CARDIO: Regular rate, well-perfused, normal S1 and S2 PULM:  CTAB no wheezing or rhonchi GI/GU:  normal bowel sounds, non-distended, non-tender MSK/SPINE:  No gross deformities, no edema SKIN:  no rash, atraumatic PSYCH:  Appropriate speech and behavior  *Additional and/or pertinent findings included in MDM below  Diagnostic and Interventional Summary    EKG Interpretation  Date/Time:    Ventricular Rate:    PR Interval:    QRS Duration:   QT Interval:    QTC Calculation:   R Axis:     Text Interpretation:        Labs Reviewed  CBC - Abnormal; Notable for the following components:      Result Value   WBC 2.6 (*)    All other components within normal limits  COMPREHENSIVE METABOLIC PANEL - Abnormal; Notable for the following components:   Glucose, Bld 105 (*)     Calcium 8.4 (*)    AST 43 (*)    Alkaline Phosphatase 37 (*)    All other components within normal limits  RESPIRATORY PANEL BY RT PCR (FLU A&B, COVID)    CT Soft Tissue Neck W Contrast  Final Result      Medications  Ampicillin-Sulbactam (UNASYN) 3 g in sodium chloride 0.9 % 100 mL IVPB (has no administration in time range)  0.9 %  sodium chloride infusion ( Intravenous New Bag/Given 07/29/20 1056)  iohexol (OMNIPAQUE) 300 MG/ML solution 100 mL (75 mLs Intravenous Contrast Given 07/29/20 1007)  dexamethasone (DECADRON) injection 10 mg (10 mg Intravenous Given 07/29/20 1056)     Procedures  /  Critical Care .Critical Care Performed by: Sabas Sous, MD Authorized by: Sabas Sous, MD   Critical care provider statement:    Critical care time (minutes):  35   Critical care was necessary to treat or prevent imminent or life-threatening deterioration of the following conditions: Epiglottitis with airway narrowing.   Critical care was time spent personally by me on the following activities:  Discussions with consultants, evaluation of patient's response to treatment, examination of patient, ordering and performing treatments and interventions, ordering and review of laboratory studies, ordering and review of radiographic studies, pulse oximetry, re-evaluation of patient's condition, obtaining history from patient or surrogate and review of old charts    ED Course and Medical Decision Making  I have reviewed the triage vital signs, the nursing notes, and pertinent available records from the EMR.  Listed above are laboratory and imaging tests that I personally ordered, reviewed, and interpreted and then considered in my medical decision making (see below for details).  Patient has a history of epiglottitis that required ICU admission about 2 years ago.  Concern for recurrence, will obtain CT imaging.      CT confirms edematous epiglottitis and surrounding structures with airway  narrowing.  Providing patient with IV Decadron and Unasyn.  Patient continues to be in no acute distress, normal vital signs, at this point the only symptom being a fullness in the throat and her voice being off her baseline.  Discussed case with Dr. Jearld Fenton, who will be happy to consult and evaluate the patient when she arrives at [facility].  Dr. Jeraldine Loots is the ED excepting Dr. At [facility] for transfer.  Dr. Jearld Fenton requests to be called or alerted when she arrives.  Patient will likely need admission to stepdown or ICU hospitalist or intensivist as the primary team.  Elmer Sow. Pilar Plate, MD [facility] Emergency Medicine [facility]  Health [email or web address]  Final Clinical Impressions(s) / ED Diagnoses     ICD-10-CM   1. Epiglottitis  J05.10     ED Discharge Orders    None       Discharge Instructions Discussed with and Provided to Patient:   Discharge Instructions   None       Sabas Sous, MD 07/29/20 1101

## 2020-07-29 NOTE — ED Provider Notes (Signed)
Pt transferred from [facility] ED after being seen by Dr. Pilar Plate. See his note for full H&P.    Physical Exam  BP (!) 131/56   Pulse 64   Temp 98.3 F (36.8 C) (Oral)   Resp 20   Ht 5\' 2"  (1.575 m)   Wt 79.4 kg   LMP  (LMP Unknown)   SpO2 95%   BMI 32.01 kg/m   Physical Exam Vitals and nursing note reviewed.  Constitutional:      General: She is not in acute distress.    Appearance: She is well-developed. She is not ill-appearing or toxic-appearing.  HENT:     Head: Normocephalic and atraumatic.     Mouth/Throat:     Comments: Tolerating secretions. No obvious oropharyngeal edema or erythema. Normal phonation.  Eyes:     Conjunctiva/sclera: Conjunctivae normal.  Cardiovascular:     Rate and Rhythm: Normal rate.  Pulmonary:     Effort: Pulmonary effort is normal.  Musculoskeletal:        General: Normal range of motion.     Cervical back: Neck supple.  Skin:    General: Skin is warm and dry.  Neurological:     Mental Status: She is alert.       ED Course/Procedures     Procedures  Results for orders placed or performed during the hospital encounter of 07/29/20  Respiratory Panel by RT PCR (Flu A&B, Covid) - Nasopharyngeal Swab   Specimen: Nasopharyngeal Swab  Result Value Ref Range   SARS Coronavirus 2 by RT PCR NEGATIVE NEGATIVE   Influenza A by PCR NEGATIVE NEGATIVE   Influenza B by PCR NEGATIVE NEGATIVE  CBC  Result Value Ref Range   WBC 2.6 (L) 4.0 - 10.5 K/uL   RBC 4.02 3.87 - 5.11 MIL/uL   Hemoglobin 12.5 12.0 - 15.0 g/dL   HCT 09/28/20 36 - 46 %   MCV 94.0 80.0 - 100.0 fL   MCH 31.1 26.0 - 34.0 pg   MCHC 33.1 30.0 - 36.0 g/dL   RDW 33.2 95.1 - 88.4 %   Platelets 295 150 - 400 K/uL   nRBC 0.0 0.0 - 0.2 %  Comprehensive metabolic panel  Result Value Ref Range   Sodium 137 135 - 145 mmol/L   Potassium 4.0 3.5 - 5.1 mmol/L   Chloride 102 98 - 111 mmol/L   CO2 23 22 - 32 mmol/L   Glucose, Bld 105 (H) 70 - 99 mg/dL   BUN 17 8 - 23 mg/dL    Creatinine, Ser 16.6 0.44 - 1.00 mg/dL   Calcium 8.4 (L) 8.9 - 10.3 mg/dL   Total Protein 6.5 6.5 - 8.1 g/dL   Albumin 3.8 3.5 - 5.0 g/dL   AST 43 (H) 15 - 41 U/L   ALT 41 0 - 44 U/L   Alkaline Phosphatase 37 (L) 38 - 126 U/L   Total Bilirubin 0.7 0.3 - 1.2 mg/dL   GFR calc non Af Amer >60 >60 mL/min   GFR calc Af Amer >60 >60 mL/min   Anion gap 12 5 - 15   CT Soft Tissue Neck W Contrast  Result Date: 07/29/2020 CLINICAL DATA:  Epiglottitis or tonsillitis suspected. EXAM: CT NECK WITH CONTRAST TECHNIQUE: Multidetector CT imaging of the neck was performed using the standard protocol following the bolus administration of intravenous contrast. CONTRAST:  55mL OMNIPAQUE IOHEXOL 300 MG/ML  SOLN COMPARISON:  CT neck January 11, 2019 FINDINGS: Pharynx and larynx: The oropharynx and  posterior nasopharynx appear normal. There is pronounced mucosal and submucosal edema involving the supraglottic region, particularly the aryepiglottic folds bilaterally. This includes some thickening of the epiglottis. There is resulting severe airway narrowing, which appears completely effaced in the region of the base of the epiglottis. Salivary glands: No inflammation, mass, or stone. Thyroid: Normal. Lymph nodes: None enlarged or abnormal density. Vascular: Calcific atherosclerosis.  No evidence of occlusion. Limited intracranial: Negative. Visualized orbits: Negative. Mastoids and visualized paranasal sinuses: Clear. Skeleton: Similar multilevel degenerative changes of the cervical spine, greatest at C5-C6. Upper chest: Negative. IMPRESSION: Mucosal and submucosal edema involving the supraglottic airway, including the aryepiglottic folds and epiglottis. There is marked associated supraglottic airway narrowing with apparent complete effacement on this study. Critical Value/emergent results were called by telephone at the time of interpretation on 07/29/2020 at 10:39 am to provider [facility] , who verbally acknowledged these  results. Electronically Signed   By: Feliberto Harts MD   On: 07/29/2020 10:46   CRITICAL CARE Performed by: Karrie Meres   Total critical care time: 31 minutes  Critical care time was exclusive of separately billable procedures and treating other patients.  Critical care was necessary to treat or prevent imminent or life-threatening deterioration.  Critical care was time spent personally by me on the following activities: development of treatment plan with patient and/or surrogate as well as nursing, discussions with consultants, evaluation of patient's response to treatment, examination of patient, obtaining history from patient or surrogate, ordering and performing treatments and interventions, ordering and review of laboratory studies, ordering and review of radiographic studies, pulse oximetry and re-evaluation of patient's condition.   MDM   Briefly, pt is a 64 y/o F presenting with difficulty swallowing and loss of voice.   CT soft tissue neck - Mucosal and submucosal edema involving the supraglottic airway, including the aryepiglottic folds and epiglottis. There is marked associated supraglottic airway narrowing with apparent complete effacement on this study.  Pt was given unasyn and decadron PTA and she states sxs are much improved. She is now able to talk. She is still c/o some trouble swallowing but she is tolerating her secretions.   12:35 PM CONSULT with Dr. Jearld Fenton. He states that pt is actually already established with [facility] so recommends contacting them.  1:27 PM CONSULT with Dr. Annalee Genta with ENT who is in agreement with plan. States that if patient is showing improvement she can f/u outpatient however if needed hospitalist can reconsult ENT if pt is clinically worsening.   1:39 PM CONSULT with Dr. Rulon Abide with hospitalist service who accepts patient for admission.      Karrie Meres, PA-C 07/29/20 1340    CuratoloMadelaine Bhat, DO 07/29/20 1340

## 2020-07-29 NOTE — ED Notes (Signed)
Patient transported to CT 

## 2020-07-29 NOTE — ED Triage Notes (Signed)
[facility] For epiglottis. Pt is A&Ox4. Pt able to speak in complete sentences and is able to control her mouth secretions.

## 2020-07-29 NOTE — Progress Notes (Signed)
Pharmacy Antibiotic Note  Olivia Lawrence is a 64 y.o. female admitted on 07/29/2020 with epiglottis.  Pharmacy has been consulted for Unasyn dosing.  WBC 2.6, SCr wnl.   Plan: -Unasyn 3 gm IV Q 6 hours -Monitor CBC, renal fx, and clinical progress  Height: 5\' 2"  (157.5 cm) Weight: 79.4 kg (175 lb) IBW/kg (Calculated) : 50.1  Temp (24hrs), Avg:98.4 F (36.9 C), Min:98.3 F (36.8 C), Max:98.5 F (36.9 C)  Recent Labs  Lab 07/29/20 0911  WBC 2.6*  CREATININE 0.88    Estimated Creatinine Clearance: 61.4 mL/min (by C-G formula based on SCr of 0.88 mg/dL).    Allergies  Allergen Reactions  . Dilaudid [Hydromorphone Hcl] Other (See Comments)    Pt states she coded after dilaudid administration  . Zetia [Ezetimibe] Rash    Antimicrobials this admission: Unasyn 10/2 >>   Dose adjustments this admission:  Microbiology results:  Thank you for allowing pharmacy to be a part of this patient's care.  12/2, PharmD., BCPS, BCCCP Clinical Pharmacist Clinical phone for 07/29/20 until 10pm: [phone] If after 10pm, please refer to [facility] for unit-specific pharmacist

## 2020-07-29 NOTE — ED Triage Notes (Addendum)
Pt arrives complaining of sore throat last night, now stating having difficulty swallowing. Feels like her "throat is closing". 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 Received Pfizer booster on Thursday.

## 2020-07-30 DIAGNOSIS — J0510 Acute epiglottitis without obstruction: Secondary | ICD-10-CM | POA: Diagnosis not present

## 2020-07-30 LAB — COMPREHENSIVE METABOLIC PANEL
ALT: 38 U/L (ref 0–44)
AST: 29 U/L (ref 15–41)
Albumin: 3.5 g/dL (ref 3.5–5.0)
Alkaline Phosphatase: 34 U/L — ABNORMAL LOW (ref 38–126)
Anion gap: 12 (ref 5–15)
BUN: 10 mg/dL (ref 8–23)
CO2: 23 mmol/L (ref 22–32)
Calcium: 9.1 mg/dL (ref 8.9–10.3)
Chloride: 104 mmol/L (ref 98–111)
Creatinine, Ser: 0.85 mg/dL (ref 0.44–1.00)
GFR calc Af Amer: >60 mL/min (ref >60)
GFR calc non Af Amer: >60 mL/min (ref >60)
Glucose, Bld: 168 mg/dL — ABNORMAL HIGH (ref 70–99)
Potassium: 4.3 mmol/L (ref 3.5–5.1)
Sodium: 139 mmol/L (ref 135–145)
Total Bilirubin: 0.5 mg/dL (ref 0.3–1.2)
Total Protein: 6.2 g/dL — ABNORMAL LOW (ref 6.5–8.1)

## 2020-07-30 LAB — CBC
HCT: 37.2 % (ref 36.0–46.0)
Hemoglobin: 12.1 g/dL (ref 12.0–15.0)
MCH: 30.6 pg (ref 26.0–34.0)
MCHC: 32.5 g/dL (ref 30.0–36.0)
MCV: 93.9 fL (ref 80.0–100.0)
Platelets: 310 10*3/uL (ref 150–400)
RBC: 3.96 MIL/uL (ref 3.87–5.11)
RDW: 12.8 % (ref 11.5–15.5)
WBC: 4.2 10*3/uL (ref 4.0–10.5)
nRBC: 0 % (ref 0.0–0.2)

## 2020-07-30 MED ORDER — FAMOTIDINE 10 MG PO TABS: 10.0000 mg | ORAL_TABLET | Freq: Every day | ORAL | 0 refills | Status: AC

## 2020-07-30 MED ORDER — FAMOTIDINE 20 MG PO TABS: 10.0000 mg | ORAL_TABLET | Freq: Every day | ORAL | Status: DC

## 2020-07-30 MED ORDER — AMLODIPINE BESYLATE 10 MG PO TABS: 10.0000 mg | ORAL_TABLET | Freq: Every day | ORAL | 0 refills | Status: AC

## 2020-07-30 MED ORDER — AMOXICILLIN-POT CLAVULANATE 875-125 MG PO TABS: 1.0000 | ORAL_TABLET | Freq: Two times a day (BID) | ORAL | 0 refills | Status: AC

## 2020-07-30 MED ORDER — PREDNISONE 10 MG PO TABS: ORAL_TABLET | ORAL | 0 refills | Status: AC

## 2020-07-30 NOTE — Progress Notes (Signed)
Patient tolerated regular diet without any problem for lunch. Discharge instruction reviewed with patient/family. All questions answered at this time. Transportation provided by family.

## 2020-07-30 NOTE — Discharge Summary (Signed)
Discharge Summary  Olivia Lawrence ERD:[id number] DOB: 12-Sep-1956  PCP: Jackalyn Lombard I, MD  Admit date: 07/29/2020 Discharge date: 07/30/2020  Time spent: , more than 50% time spent on coordination of care.   Recommendations for Outpatient Follow-up:  1. F/u with PCP in three days for hospital discharge follow up to reevaluate return to work issue 2. F/u with ENT as needed  Medication changes: Stop lisinopril Increase norvasc Take augmentin and prednsione taper  Discharge Diagnoses:  Active Hospital Problems   Diagnosis Date Noted  . Epiglottitis 01/11/2019  . Hypertension   . Hypothyroidism   . GERD (gastroesophageal reflux disease)   . Depression     Resolved Hospital Problems  No resolved problems to display.    Discharge Condition: stable  Diet recommendation: heart healthy  Filed Weights   07/29/20 0840 07/29/20 1233  Weight: 79.4 kg 79.4 kg    History of present illness: (per admitting MD Dr Jacqulyn Bath) Chief Complaint: Difficulty in swallowing  HPI: Olivia Lawrence is a 64 y.o. female with medical history significant of coronary artery disease, hypertension, hyperlipidemia, GERD, hypothyroidism, depression/anxiety presents to emergency department with difficulty in swallowing  Patient tells me that he started having sore throat and difficulty in swallowing and sensation of choking started yesterday evening.  She received Covid booster shot 2 days ago and initially she thought her symptoms are indeed related to Covid vaccine.  Due to worsening sore throat and change in voice she decided to go to [facility] for further evaluation and management.  She reports generalized weakness, no energy and fatigue however denies fever, chills, headache, blurry vision, chest pain, shortness of breath, wheezing, leg swelling, cough, congestion, urinary or bowel changes.  No history of smoking, illicit drug use however drinks 2 glasses of wine every night.  She  had a history of epiglottitis in the past required ICU admission.  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  She cannot quit her job-she requesting for a letter stating to "stay away from large group of people due to recurrent symptoms".  ED Course: Upon arrival to ED: Patient's vital signs stable, afebrile, maintaining oxygen saturation on room air.  CBC shows leukopenia of 2.6, COVID-19 negative.  CT soft tissue neck concerning for mucosal and submucosal edema involving the supraglottic airway, including the aryepiglottic folds and epiglottis.  There is mild associated supraglottic airway narrowing with apparent complete effacement on the study.  Patient was given Unasyn and Decadron in ED.  EDP consulted ENT who agreed with antibiotics and medical admission.  [facility] hospitalist consulted for admission for acute epiglottitis  Hospital Course:  Principal Problem:   Epiglottitis Active Problems:   Hypertension   Hypothyroidism   GERD (gastroesophageal reflux disease)   Depression  Acute epiglottitis -Patient is  Afebrile, no leukocytosis, maintaining oxygen saturation on room air.  Sitting comfortably on the bed.  No respiratory distress/stridor/stridor.  COVID-19 negative -Reviewed CT soft tissue neck. -Received Unasyn and Decadron in ED. -EDP consulted ENT who recommended to continue antibiotic and steroid and call if needed -She continued to improve, report swelling has resolved, she is able to tolerate regular diet -She is discharged on Augmentin , prednisone taper and pepcid -Of note she was hospitalized for similar issues in 2020, at that time she had fever and leukocytosis and similar CT soft tissue neck findings -Upon review of her home medication,  she is taking lisinopril, so ACE inhibitor induced edema could be on differential diagnosis -I advised her to stop taking  lisinopril  Reference: A rare presentation of angioedema with isolated retropharyngeal and supraglottic involvement H. Patela , S. Kantb and R. Chow JOURNAL OF COMMUNITY HOSPITAL INTERNAL MEDICINE PERSPECTIVES 2019, VOL. 9, NO. 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 -Due to acute epiglottitis could be potentially life threatening, I think her request for trial teaching is Rena reasonable for the current time being, she is to return to PCP to reevaluate and assist her ongoing needs. She also understands she can follow-up with ENT as well.  a letter  sent to her PCP Dr. Sharion Dove per patient's request.  Leukopenia WBC 2.6 on presentation, resolved WBC 4.2 at discharge Follow-up with PCP   Hypertension -DC lisinopril -Increase Norvasc -Continue Coreg  Paroxysmal A. fib: Rate controlled.  Continue Coreg and flecainide. She is on aspirin 81 mg daily  Coronary artery disease: Stable.  No ACS symptoms.  Continue aspirin, nitro as needed, Coreg, Repatha  Hyperlipidemia; she is on fish oil and Repatha  Hypothyroidism:  TSH 0.34 ,continue levothyroxine, follow-up with PCP  Depression/anxiety: Continue Lexapro, Ativan as needed  Body mass index is 32.01 kg/m.   Procedures:  None  Consultations:  EDP discussed case with ENT  Discharge Exam: BP (!) 127/53 (BP Location: Right Arm)   Pulse 71   Temp 98.2 F (36.8 C) (Oral)   Resp 16   Ht 5\' 2"  (1.575 m)   Wt 79.4 kg   LMP  (LMP Unknown)   SpO2 95%   BMI 32.01 kg/m   General: NAD Cardiovascular: RRR Respiratory: CTABL  Discharge Instructions You were cared for by a hospitalist during your hospital stay. If you have any questions about your discharge medications or the care you received while you were in the hospital after you are discharged, you can call the unit and asked to speak with the hospitalist on call if the  hospitalist that took care of you is not available. Once you are discharged, your primary care physician will handle any further medical issues. Please note that NO REFILLS for any discharge medications will be authorized once you are discharged, as it is imperative that you return to your primary care physician (or establish a relationship with a primary care physician if you do not have one) for your aftercare needs so that they can reassess your need for medications and monitor your lab values.  Discharge Instructions    Diet - low sodium heart healthy   Complete by: As directed    Increase activity slowly   Complete by: As directed      Allergies as of 07/30/2020      Reactions   Dilaudid [hydromorphone Hcl] Other (See Comments)   Pt states she coded after dilaudid administration   Zetia [ezetimibe] Rash      Medication List    STOP taking these medications   lisinopril 40 MG tablet Commonly known as: ZESTRIL     TAKE these medications   amLODipine 10 MG tablet Commonly known as: NORVASC Take 1 tablet (10 mg total) by mouth daily. What changed:   medication strength  how much to take  when to take this   amoxicillin-clavulanate 875-125 MG tablet Commonly known as: Augmentin Take 1 tablet by mouth 2 (two) times daily for 5 days.  ascorbic acid 500 MG tablet Commonly known as: VITAMIN C Take 500 mg by mouth daily.   aspirin EC 81 MG tablet Take 81 mg by mouth daily with supper.   carvedilol 12.5 MG tablet Commonly known as: COREG Take 12.5 mg by mouth 2 (two) times daily with a meal.   cetirizine 10 MG tablet Commonly known as: ZYRTEC Take 10 mg by mouth daily.   Cinnamon 500 MG Tabs Take 500 mg by mouth daily.   escitalopram 20 MG tablet Commonly known as: LEXAPRO Take 20 mg by mouth daily with supper.   famotidine 10 MG tablet Commonly known as: PEPCID Take 1 tablet (10 mg total) by mouth daily for 4 days.   Fish Oil 1000 MG Caps Take 1,000 mg by  mouth daily.   flecainide 50 MG tablet Commonly known as: TAMBOCOR Take 50 mg by mouth 2 (two) times daily with a meal.   Ginkgo Biloba 120 MG Caps Take 120 mg by mouth daily.   ibuprofen 200 MG tablet Commonly known as: ADVIL Take 600 mg by mouth every 6 (six) hours as needed for fever or headache (pain).   Lecithin 1000 MG Chew Chew 1,000 mg by mouth daily.   levothyroxine 88 MCG tablet Commonly known as: SYNTHROID Take 88 mcg by mouth daily before breakfast.   LORazepam 0.5 MG tablet Commonly known as: ATIVAN Take 0.5 mg by mouth daily as needed for anxiety.   meloxicam 15 MG tablet Commonly known as: MOBIC Take 15 mg by mouth daily with breakfast.   Multi-Minerals Tabs Take 1 tablet by mouth daily.   Nexletol 180 MG Tabs Generic drug: Bempedoic Acid Take 180 mg by mouth daily with breakfast.   nitroGLYCERIN 0.4 MG SL tablet Commonly known as: NITROSTAT Place 0.4 mg under the tongue every 5 (five) minutes as needed for chest pain.   omeprazole 20 MG tablet Commonly known as: PRILOSEC OTC Take 20 mg by mouth daily as needed (acid reflux/indigestion).   OVER THE COUNTER MEDICATION Take 1,000 mg by mouth daily. "EDTA"   predniSONE 10 MG tablet Commonly known as: DELTASONE Label  & dispense according to the schedule below. 4 Pills PO on day one, 3Pills PO on day two, 2 Pills PO on day three, 1 Pills PO on day four,  then STOP.  Total of 10 tabs   Repatha 140 MG/ML Sosy Generic drug: Evolocumab Inject 140 mg into the skin every 14 (fourteen) days.   Vitamin D-3 125 MCG (5000 UT) Tabs Take 5,000 Units by mouth daily.      Allergies  Allergen Reactions  . Dilaudid [Hydromorphone Hcl] Other (See Comments)    Pt states she coded after dilaudid administration  . Zetia [Ezetimibe] Rash    Follow-up Information    Jackalyn LombardMoreira, Fernanda I, MD Follow up in 3 day(s).   Specialty: Family Medicine Why: hospital discharge follow up Contact information: [id number] [place] [id number] [phone]        [facility].   Specialty: Otolaryngology Contact information: [address] [place] [address] [phone]                The results of significant diagnostics from this hospitalization (including imaging, microbiology, ancillary and laboratory) are listed below for reference.    Significant Diagnostic Studies: CT Soft Tissue Neck W Contrast  Result Date: 07/29/2020 CLINICAL DATA:  Epiglottitis or tonsillitis suspected. EXAM: CT NECK WITH  CONTRAST TECHNIQUE: Multidetector CT imaging of the neck was performed using the standard protocol following the bolus administration of intravenous contrast. CONTRAST:  89mL OMNIPAQUE IOHEXOL 300 MG/ML  SOLN COMPARISON:  CT neck January 11, 2019 FINDINGS: Pharynx and larynx: The oropharynx and posterior nasopharynx appear normal. There is pronounced mucosal and submucosal edema involving the supraglottic region, particularly the aryepiglottic folds bilaterally. This includes some thickening of the epiglottis. There is resulting severe airway narrowing, which appears completely effaced in the region of the base of the epiglottis. Salivary glands: No inflammation, mass, or stone. Thyroid: Normal. Lymph nodes: None enlarged or abnormal density. Vascular: Calcific atherosclerosis.  No evidence of occlusion. Limited intracranial: Negative. Visualized orbits: Negative. Mastoids and visualized paranasal sinuses: Clear. Skeleton: Similar multilevel degenerative changes of the cervical spine, greatest at C5-C6. Upper chest: Negative. IMPRESSION: Mucosal and submucosal edema involving the supraglottic airway, including the aryepiglottic folds and epiglottis. There is marked associated supraglottic airway narrowing with apparent complete effacement on this study. Critical Value/emergent results were called by telephone at the time of interpretation on 07/29/2020 at  10:39 am to provider [facility] , who verbally acknowledged these results. Electronically Signed   By: Feliberto Harts MD   On: 07/29/2020 10:46    Microbiology: Recent Results (from the past 240 hour(s))  Respiratory Panel by RT PCR (Flu A&B, Covid) - Nasopharyngeal Swab     Status: None   Collection Time: 07/29/20 11:02 AM   Specimen: Nasopharyngeal Swab  Result Value Ref Range Status   SARS Coronavirus 2 by RT PCR NEGATIVE NEGATIVE Final    Comment: (NOTE) SARS-CoV-2 target nucleic acids are NOT DETECTED.  The SARS-CoV-2 RNA is generally detectable in upper respiratoy specimens during the acute phase of infection. The lowest concentration of SARS-CoV-2 viral copies this assay can detect is 131 copies/mL. A negative result does not preclude SARS-Cov-2 infection and should not be used as the sole basis for treatment or other patient management decisions. A negative result may occur with  improper specimen collection/handling, submission of specimen other than nasopharyngeal swab, presence of viral mutation(s) within the areas targeted by this assay, and inadequate number of viral copies (<131 copies/mL). A negative result must be combined with clinical observations, patient history, and epidemiological information. The expected result is Negative.  Fact Sheet for Patients:  https://www.moore.com/  Fact Sheet for Healthcare Providers:  https://www.young.biz/  This test is no t yet approved or cleared by the [place] FDA and  has been authorized for detection and/or diagnosis of SARS-CoV-2 by FDA under an Emergency Use Authorization (EUA). This EUA will remain  in effect (meaning this test can be used) for the duration of the COVID-19 declaration under Section 564(b)(1) of the Act, 21 U.S.C. section 360bbb-3(b)(1), unless the authorization is terminated or revoked sooner.     Influenza A by PCR NEGATIVE NEGATIVE Final   Influenza  B by PCR NEGATIVE NEGATIVE Final    Comment: (NOTE) The Xpert Xpress SARS-CoV-2/FLU/RSV assay is intended as an aid in  the diagnosis of influenza from Nasopharyngeal swab specimens and  should not be used as a sole basis for treatment. Nasal washings and  aspirates are unacceptable for Xpert Xpress SARS-CoV-2/FLU/RSV  testing.  Fact Sheet for Patients: https://www.moore.com/  Fact Sheet for Healthcare Providers: https://www.young.biz/  This test is not yet approved or cleared by the [place] FDA and  has been authorized for detection and/or diagnosis of SARS-CoV-2 by  FDA under an Emergency Use Authorization (EUA). This EUA will  remain  in effect (meaning this test can be used) for the duration of the  Covid-19 declaration under Section 564(b)(1) of the Act, 21  U.S.C. section 360bbb-3(b)(1), unless the authorization is  terminated or revoked. Performed at [facility], [address] [place], [place] [address]      Labs: Basic Metabolic Panel: Recent Labs  Lab 07/29/20 0911 07/29/20 1636 07/30/20 0318  NA 137  --  139  K 4.0  --  4.3  CL 102  --  104  CO2 23  --  23  GLUCOSE 105*  --  168*  BUN 17  --  10  CREATININE 0.88 0.93 0.85  CALCIUM 8.4*  --  9.1  MG  --  2.0  --    Liver Function Tests: Recent Labs  Lab 07/29/20 0911 07/30/20 0318  AST 43* 29  ALT 41 38  ALKPHOS 37* 34*  BILITOT 0.7 0.5  PROT 6.5 6.2*  ALBUMIN 3.8 3.5   No results for input(s): LIPASE, AMYLASE in the last 168 hours. No results for input(s): AMMONIA in the last 168 hours. CBC: Recent Labs  Lab 07/29/20 0911 07/29/20 1636 07/30/20 0318  WBC 2.6* 3.2* 4.2  HGB 12.5 12.8 12.1  HCT 37.8 38.9 37.2  MCV 94.0 93.5 93.9  PLT 295 266 310   Cardiac Enzymes: No results for input(s): CKTOTAL, CKMB, CKMBINDEX, TROPONINI in the last 168 hours. BNP: BNP (last 3 results) No results for input(s): BNP in the last 8760  hours.  ProBNP (last 3 results) No results for input(s): PROBNP in the last 8760 hours.  CBG: No results for input(s): GLUCAP in the last 168 hours.     Signed:  Albertine Grates MD, PhD, FACP  [facility] 07/30/2020, 11:32 AM

## 2021-05-24 ENCOUNTER — Emergency Department (HOSPITAL_BASED_OUTPATIENT_CLINIC_OR_DEPARTMENT_OTHER)
Admission: EM | Admit: 2021-05-24 | Discharge: 2021-05-24 | Disposition: A | Discharge status: Home / Self Care | Payer: Medicare PPO | Attending: Emergency Medicine | Admitting: Emergency Medicine

## 2021-05-24 ENCOUNTER — Encounter (HOSPITAL_BASED_OUTPATIENT_CLINIC_OR_DEPARTMENT_OTHER): Payer: Self-pay | Admitting: *Deleted

## 2021-05-24 ENCOUNTER — Emergency Department (HOSPITAL_BASED_OUTPATIENT_CLINIC_OR_DEPARTMENT_OTHER): Payer: Medicare PPO

## 2021-05-24 ENCOUNTER — Other Ambulatory Visit: Payer: Self-pay

## 2021-05-24 DIAGNOSIS — Z87891 Personal history of nicotine dependence: Secondary | ICD-10-CM | POA: Diagnosis not present

## 2021-05-24 DIAGNOSIS — W25XXXA Contact with sharp glass, initial encounter: Secondary | ICD-10-CM | POA: Insufficient documentation

## 2021-05-24 DIAGNOSIS — Z7982 Long term (current) use of aspirin: Secondary | ICD-10-CM | POA: Diagnosis not present

## 2021-05-24 DIAGNOSIS — S6991XA Unspecified injury of right wrist, hand and finger(s), initial encounter: Secondary | ICD-10-CM | POA: Diagnosis present

## 2021-05-24 DIAGNOSIS — Z79899 Other long term (current) drug therapy: Secondary | ICD-10-CM | POA: Insufficient documentation

## 2021-05-24 DIAGNOSIS — E039 Hypothyroidism, unspecified: Secondary | ICD-10-CM | POA: Diagnosis not present

## 2021-05-24 DIAGNOSIS — S61210A Laceration without foreign body of right index finger without damage to nail, initial encounter: Secondary | ICD-10-CM | POA: Insufficient documentation

## 2021-05-24 DIAGNOSIS — I2510 Atherosclerotic heart disease of native coronary artery without angina pectoris: Secondary | ICD-10-CM | POA: Diagnosis not present

## 2021-05-24 DIAGNOSIS — S90121A Contusion of right lesser toe(s) without damage to nail, initial encounter: Secondary | ICD-10-CM

## 2021-05-24 DIAGNOSIS — I10 Essential (primary) hypertension: Secondary | ICD-10-CM | POA: Insufficient documentation

## 2021-05-24 DIAGNOSIS — Z8616 Personal history of COVID-19: Secondary | ICD-10-CM | POA: Insufficient documentation

## 2021-05-24 DIAGNOSIS — M79671 Pain in right foot: Secondary | ICD-10-CM | POA: Insufficient documentation

## 2021-05-24 MED ORDER — LIDOCAINE HCL (PF) 1 % IJ SOLN
30.0000 mL | Freq: Once | INTRAMUSCULAR | Status: AC
  Administered 2021-05-24: 5 mL
  Filled 2021-05-24: qty 30

## 2021-05-24 NOTE — ED Notes (Signed)
Right 1st digit lac, bleeding controlled, states cut it on Pyrex bowl that broke.

## 2021-05-24 NOTE — Discharge Instructions (Addendum)
You are seen in the ER today for your finger laceration and your injured toes.  There are no broken bones in your foot, and your wound was able to be repaired with tissue adhesive called Dermabond.  This will fall off on its own.  Please do not submerge in water and do not pick at the glue.  You may ice your toes and use Tylenol or ibuprofen as needed for your pain.  Follow-up with your primary care doctor and return to the ER if you develop any redness, swelling, puslike drainage from your wound, redness streaking up your hand, or any other new severe symptoms.

## 2021-05-24 NOTE — ED Triage Notes (Addendum)
C/o right index finger lac x 1 hr ago , also c/o right 4th and  5th toe injury x 1 day ago

## 2021-05-24 NOTE — ED Provider Notes (Signed)
[facility] EMERGENCY DEPARTMENT Provider Note   CSN: [id number] Arrival date & time: 05/24/21  1816     History Chief Complaint  Patient presents with   Extremity Laceration    Olivia DenseChristina Vaughn is a 65 y.o. female who presents with concern for laceration to the right index finger sustained this evening when she excellently broke a glass jar full of old food and it cut her finger.  Last tetanus shot was in August 2021.  Additionally endorses that last night she was walking through grass injection was stubbed her second third and fourth toes on the edge of her freezer  and states they have been painful to walk on since that time.  I personally read this patient's medical records.  She has history of CAD, hypertension, hypothyroidism, is on daily aspirin but is otherwise not on any anticoagulation.  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     No family history on file.  Social History   Tobacco Use   Smoking status: Former   Smokeless tobacco: Never  Building services engineerVaping Use   Vaping Use: Never used  Substance Use Topics   Alcohol use: Yes    Alcohol/week: 14.0 standard drinks    Types: 14 Glasses of wine per week   Drug use: Never    Home Medications Prior to Admission medications   Medication Sig Start Date End Date Taking? Authorizing Provider  amLODipine (NORVASC) 10 MG tablet Take 1 tablet (10 mg total) by mouth daily. 07/30/20 07/30/21  Albertine GratesXu, Fang, MD  ascorbic acid (VITAMIN C) 500 MG tablet Take 500 mg by mouth daily.    [provider]  aspirin EC 81  MG tablet Take 81 mg by mouth daily with supper.     [provider]  Bempedoic Acid (NEXLETOL) 180 MG TABS Take 180 mg by mouth daily with breakfast.    [provider]  carvedilol (COREG) 12.5 MG tablet Take 12.5 mg by mouth 2 (two) times daily with a meal.    [provider]  cetirizine (ZYRTEC) 10 MG tablet Take 10 mg by mouth daily.    [provider]  Cholecalciferol (VITAMIN D-3) 125 MCG (5000 UT) TABS Take 5,000 Units by mouth daily.    [provider]  Cinnamon 500 MG TABS Take 500 mg by mouth daily.    [provider]  escitalopram (LEXAPRO) 20 MG tablet Take 20 mg by mouth daily with supper.    [provider]  Evolocumab (REPATHA) 140 MG/ML SOSY Inject 140 mg into the skin every 14 (fourteen) days.    [provider]  famotidine (PEPCID) 10 MG tablet Take 1 tablet (10 mg total) by mouth daily for 4 days. 07/30/20 08/03/20  Albertine GratesXu, Fang, MD  flecainide (TAMBOCOR) 50 MG tablet Take 50 mg by mouth 2 (two) times daily with a meal.     [provider]  Ginkgo Biloba 120 MG CAPS Take 120 mg by mouth daily.  [provider]  ibuprofen (ADVIL) 200 MG tablet Take 600 mg by mouth every 6 (six) hours as needed for fever or headache (pain).    [provider]  Lecithin 1000 MG CHEW Chew 1,000 mg by mouth daily.    [provider]  levothyroxine (SYNTHROID, LEVOTHROID) 88 MCG tablet Take 88 mcg by mouth daily before breakfast.    [provider]  LORazepam (ATIVAN) 0.5 MG tablet Take 0.5 mg by mouth daily as needed for anxiety.    [provider]  meloxicam (MOBIC) 15 MG tablet Take 15 mg by mouth daily with breakfast.     [provider]  Multiple Minerals (MULTI-MINERALS) TABS Take 1 tablet by mouth daily.    [provider]  nitroGLYCERIN (NITROSTAT) 0.4 MG SL tablet Place 0.4 mg under the tongue every 5 (five) minutes as needed for chest pain. 06/13/17    [provider]  Omega-3 Fatty Acids (FISH OIL) 1000 MG CAPS Take 1,000 mg by mouth daily.    [provider]  omeprazole (PRILOSEC OTC) 20 MG tablet Take 20 mg by mouth daily as needed (acid reflux/indigestion).    [provider]  OVER THE COUNTER MEDICATION Take 1,000 mg by mouth daily. "EDTA"    [provider]  predniSONE (DELTASONE) 10 MG tablet Label  & dispense according to the schedule below. 4 Pills PO on day one, 3Pills PO on day two, 2 Pills PO on day three, 1 Pills PO on day four,  then STOP.  Total of 10 tabs 07/30/20   Albertine Grates, MD    Allergies    Dilaudid [hydromorphone hcl] and Zetia [ezetimibe]  Review of Systems   Review of Systems  Constitutional: Negative.   HENT: Negative.    Respiratory: Negative.    Cardiovascular: Negative.   Gastrointestinal: Negative.   Musculoskeletal:  Positive for arthralgias.  Skin:  Positive for wound.  Neurological: Negative.    Physical Exam Updated Vital Signs BP 135/64   Pulse 66   Temp 98.9 F (37.2 C) (Oral)   Resp 16   Ht 5\' 2"  (1.575 m)   Wt 76.2 kg   LMP  (LMP Unknown)   SpO2 99%   BMI 30.73 kg/m   Physical Exam Vitals and nursing note reviewed.  HENT:     Head: Normocephalic and atraumatic.  Eyes:     General: No scleral icterus.       Right eye: No discharge.        Left eye: No discharge.     Conjunctiva/sclera: Conjunctivae normal.  Cardiovascular:     Rate and Rhythm: Normal rate and regular rhythm.  Pulmonary:     Effort: Pulmonary effort is normal.  Musculoskeletal:     Right hand: Laceration present. Normal range of motion. Normal capillary refill. Normal pulse.     Left hand: Normal.       Hands:     Right lower leg: No edema.     Left lower leg: No edema.     Right ankle: Normal.     Right Achilles Tendon: Normal.     Left ankle: Normal.     Left Achilles Tendon: Normal.     Right foot: Normal capillary refill. Tenderness and bony tenderness present.  Normal pulse.     Left foot: Normal.       Feet:  Skin:    General: Skin is warm and dry.     Capillary Refill: Capillary refill takes less than  2 seconds.  Neurological:     General: No focal deficit present.     Mental Status: She is alert.  Psychiatric:        Mood and Affect: Mood normal.    ED Results / Procedures / Treatments   Labs (all labs ordered are listed, but only abnormal results are displayed) Labs Reviewed - No data to display  EKG None  Radiology No results found.  Procedures .Marland KitchenLaceration Repair  Date/Time: 05/24/2021 7:23 PM Performed by: Paris Lore, PA-C Authorized by: Paris Lore, PA-C   Consent:    Consent obtained:  Verbal   Consent given by:  Patient   Risks discussed:  Infection, need for additional repair, pain, poor cosmetic result and poor wound healing   Alternatives discussed:  No treatment and delayed treatment Universal protocol:    Procedure explained and questions answered to patient or proxy's satisfaction: yes     Relevant documents present and verified: yes     Test results available: yes     Imaging studies available: yes     Required blood products, implants, devices, and special equipment available: yes     Site/side marked: yes     Immediately prior to procedure, a time out was called: yes     Patient identity confirmed:  Verbally with patient Anesthesia:    Anesthesia method:  Local infiltration Pre-procedure details:    Preparation:  Imaging obtained to evaluate for foreign bodies Exploration:    Limited defect created (wound extended): no     Hemostasis achieved with:  Direct pressure   Imaging obtained: x-ray     Imaging outcome: foreign body not noted     Wound extent: no foreign bodies/material noted, no tendon damage noted and no underlying fracture noted     Contaminated: no   Treatment:    Area cleansed with:  Saline   Amount of cleaning:  Extensive   Irrigation solution:  Sterile  saline Skin repair:    Repair method:  Tissue adhesive Approximation:    Approximation:  Close Repair type:    Repair type:  Simple Post-procedure details:    Procedure completion:  Tolerated well, no immediate complications   Medications Ordered in ED Medications  lidocaine (PF) (XYLOCAINE) 1 % injection 30 mL (has no administration in time range)    ED Course  I have reviewed the triage vital signs and the nursing notes.  Pertinent labs & imaging results that were available during my care of the patient were reviewed by me and considered in my medical decision making (see chart for details).    MDM Rules/Calculators/A&P                         65 year old female who presents with concern for right index finger laceration sustained 1 hour prior to arrival and right second through fourth toe injury sustained yesterday.  Vital signs are normal on intake.  Physical exam did reveal a 1.5 cm laceration to the dorsum of the right distal index finger, oozing a slight amount of blood.  Full range of motion of the digit.  Normal capillary refill, 2+ radial pulse.  Slight bruising and tenderness patient over the second through fourth right toes, buddy taped together by the patient.  FROM of toes.  Plain film of the finger negative for retained foreign body, plain films of the foot negative for acute osseous abnormality.  Lac repaired as above, patient tolerated procedure  well.  She is up-to-date on her tetanus immunization.  Wound was thoroughly irrigated, does not warrant prophylactic antibiotics at this time.  No further work-up warranted in the ED as time.  Olivia Lawrence voiced understanding of her medical evaluation and treatment plan.  Each of her questions was answered to her expressed satisfaction.  Return precautions given.  Patient is well-appearing, stable, and appropriate for discharge at this time.  This chart was dictated using voice recognition software, Dragon. Despite the best  efforts of this provider to proofread and correct errors, errors may still occur which can change documentation meaning.  Final Clinical Impression(s) / ED Diagnoses Final diagnoses:  None    Rx / DC Orders ED Discharge Orders     None        Sherrilee Gilles 05/24/21 1924    Charlynne Pander, MD 05/25/21 1454

## 2021-11-09 ENCOUNTER — Emergency Department (HOSPITAL_BASED_OUTPATIENT_CLINIC_OR_DEPARTMENT_OTHER): Payer: Medicare PPO

## 2021-11-09 ENCOUNTER — Emergency Department (HOSPITAL_BASED_OUTPATIENT_CLINIC_OR_DEPARTMENT_OTHER)
Admission: EM | Admit: 2021-11-09 | Discharge: 2021-11-09 | Disposition: A | Discharge status: Home / Self Care | Payer: Medicare PPO | Attending: Emergency Medicine | Admitting: Emergency Medicine

## 2021-11-09 ENCOUNTER — Other Ambulatory Visit: Payer: Self-pay

## 2021-11-09 ENCOUNTER — Encounter (HOSPITAL_BASED_OUTPATIENT_CLINIC_OR_DEPARTMENT_OTHER): Payer: Self-pay

## 2021-11-09 DIAGNOSIS — I2510 Atherosclerotic heart disease of native coronary artery without angina pectoris: Secondary | ICD-10-CM | POA: Diagnosis not present

## 2021-11-09 DIAGNOSIS — R072 Precordial pain: Secondary | ICD-10-CM | POA: Insufficient documentation

## 2021-11-09 DIAGNOSIS — Z7982 Long term (current) use of aspirin: Secondary | ICD-10-CM | POA: Insufficient documentation

## 2021-11-09 DIAGNOSIS — R5383 Other fatigue: Secondary | ICD-10-CM | POA: Insufficient documentation

## 2021-11-09 DIAGNOSIS — I10 Essential (primary) hypertension: Secondary | ICD-10-CM | POA: Diagnosis not present

## 2021-11-09 DIAGNOSIS — Z79899 Other long term (current) drug therapy: Secondary | ICD-10-CM | POA: Insufficient documentation

## 2021-11-09 DIAGNOSIS — R079 Chest pain, unspecified: Secondary | ICD-10-CM

## 2021-11-09 LAB — CBC
HCT: 37.8 % (ref 36.0–46.0)
Hemoglobin: 12.7 g/dL (ref 12.0–15.0)
MCH: 31.8 pg (ref 26.0–34.0)
MCHC: 33.6 g/dL (ref 30.0–36.0)
MCV: 94.5 fL (ref 80.0–100.0)
Platelets: 315 10*3/uL (ref 150–400)
RBC: 4 MIL/uL (ref 3.87–5.11)
RDW: 12.9 % (ref 11.5–15.5)
WBC: 5.2 10*3/uL (ref 4.0–10.5)
nRBC: 0 % (ref 0.0–0.2)

## 2021-11-09 LAB — BASIC METABOLIC PANEL
Anion gap: 7 (ref 5–15)
BUN: 23 mg/dL (ref 8–23)
CO2: 24 mmol/L (ref 22–32)
Calcium: 8.9 mg/dL (ref 8.9–10.3)
Chloride: 106 mmol/L (ref 98–111)
Creatinine, Ser: 0.94 mg/dL (ref 0.44–1.00)
GFR, Estimated: >60 mL/min (ref >60)
Glucose, Bld: 99 mg/dL (ref 70–99)
Potassium: 4.4 mmol/L (ref 3.5–5.1)
Sodium: 137 mmol/L (ref 135–145)

## 2021-11-09 LAB — TROPONIN I (HIGH SENSITIVITY)
Troponin I (High Sensitivity): 16 ng/L (ref <18)
Troponin I (High Sensitivity): 15 ng/L (ref <18)

## 2021-11-09 NOTE — Discharge Instructions (Addendum)
You were seen in the emergency department for an episode of chest pain.  You had a chest x-ray EKG and lab work that did not show any evidence of heart attack.  Please continue regular medications and follow-up with your primary care doctor and cardiologist.  Return to the emergency department if any worsening or concerning symptoms.

## 2021-11-09 NOTE — ED Notes (Signed)
Patient updated on plan of care. All questions answered.

## 2021-11-09 NOTE — ED Triage Notes (Signed)
Pt c/o chest tightness started ~3pm-NAD-steady gait

## 2021-11-09 NOTE — ED Provider Notes (Signed)
[facility] EMERGENCY DEPARTMENT Provider Note   CSN: [id number] Arrival date & time: 11/09/21  1601     History  Chief Complaint  Patient presents with   Chest Pain    Olivia Lawrence is a 66 y.o. female.  She is here with a complaint of chest tightness that radiated to her jaw that started around 3 PM today at rest.  She said it lasted about 30 or 40 minutes and has now resolved.  Was not associated with any dizziness shortness of breath diaphoresis nausea or vomiting.  She said she has 2 stents in from a heart attack that she had 4 years ago.  Those symptoms were different but still vague.  She said she just felt very fatigued and heavy all over.  She has a history of paroxysmal A. Fib.  He is on aspirin daily but no other blood thinners.  She said she has had a lot of stress recently with her mother who just died in hospice.  The history is provided by the patient.  Chest Pain Pain location:  Substernal area Pain quality: tightness   Pain radiates to:  L jaw and R jaw Pain severity:  Moderate Onset quality:  Sudden Duration:  30 minutes Timing:  Intermittent Progression:  Resolved Chronicity:  New Context: at rest   Relieved by:  None tried Worsened by:  Nothing Ineffective treatments:  None tried Associated symptoms: no abdominal pain, no back pain, no cough, no diaphoresis, no fever, no headache, no nausea, no shortness of breath and no vomiting   Risk factors: coronary artery disease and hypertension       Home Medications Prior to Admission medications   Medication Sig Start Date End Date Taking? Authorizing Provider  amLODipine (NORVASC) 10 MG tablet Take 1 tablet (10 mg total) by mouth daily. 07/30/20 07/30/21  Florencia Reasons, MD  ascorbic acid (VITAMIN C) 500 MG tablet Take 500 mg by mouth daily.    [provider]  aspirin EC 81 MG tablet Take 81 mg by mouth daily with supper.     [provider]  Bempedoic Acid (NEXLETOL) 180 MG TABS Take 180  mg by mouth daily with breakfast.    [provider]  carvedilol (COREG) 12.5 MG tablet Take 12.5 mg by mouth 2 (two) times daily with a meal.    [provider]  cetirizine (ZYRTEC) 10 MG tablet Take 10 mg by mouth daily.    [provider]  Cholecalciferol (VITAMIN D-3) 125 MCG (5000 UT) TABS Take 5,000 Units by mouth daily.    [provider]  Cinnamon 500 MG TABS Take 500 mg by mouth daily.    [provider]  escitalopram (LEXAPRO) 20 MG tablet Take 20 mg by mouth daily with supper.    [provider]  Evolocumab (REPATHA) 140 MG/ML SOSY Inject 140 mg into the skin every 14 (fourteen) days.    [provider]  famotidine (PEPCID) 10 MG tablet Take 1 tablet (10 mg total) by mouth daily for 4 days. 07/30/20 08/03/20  Florencia Reasons, MD  flecainide (TAMBOCOR) 50 MG tablet Take 50 mg by mouth 2 (two) times daily with a meal.     [provider]  Ginkgo Biloba 120 MG CAPS Take 120 mg by mouth daily.    [provider]  ibuprofen (ADVIL) 200 MG tablet Take 600 mg by mouth every 6 (six) hours as needed for fever or headache (pain).    [provider]  Lecithin 1000 MG CHEW Chew 1,000 mg by mouth daily.    [provider]  levothyroxine (SYNTHROID, LEVOTHROID) 88 MCG tablet Take 88 mcg by mouth daily before breakfast.    [provider]  LORazepam (ATIVAN) 0.5 MG tablet Take 0.5 mg by mouth daily as needed for anxiety.    [provider]  meloxicam (MOBIC) 15 MG tablet Take 15 mg by mouth daily with breakfast.     [provider]  Multiple Minerals (MULTI-MINERALS) TABS Take 1 tablet by mouth daily.    [provider]  nitroGLYCERIN (NITROSTAT) 0.4 MG SL tablet Place 0.4 mg under the tongue every 5 (five) minutes as needed for chest pain. 06/13/17   [provider]  Omega-3 Fatty Acids (FISH OIL) 1000 MG CAPS Take 1,000 mg by mouth daily.    [provider]   omeprazole (PRILOSEC OTC) 20 MG tablet Take 20 mg by mouth daily as needed (acid reflux/indigestion).    [provider]  OVER THE COUNTER MEDICATION Take 1,000 mg by mouth daily. "EDTA"    [provider]  predniSONE (DELTASONE) 10 MG tablet Label  & dispense according to the schedule below. 4 Pills PO on day one, 3Pills PO on day two, 2 Pills PO on day three, 1 Pills PO on day four,  then STOP.  Total of 10 tabs 07/30/20   Florencia Reasons, MD      Allergies    Dilaudid [hydromorphone hcl] and Zetia [ezetimibe]    Review of Systems   Review of Systems  Constitutional:  Negative for diaphoresis and fever.  HENT:  Negative for sore throat.   Eyes:  Negative for visual disturbance.  Respiratory:  Negative for cough and shortness of breath.   Cardiovascular:  Positive for chest pain.  Gastrointestinal:  Negative for abdominal pain, nausea and vomiting.  Genitourinary:  Negative for dysuria.  Musculoskeletal:  Negative for back pain.  Skin:  Negative for rash.  Neurological:  Negative for headaches.   Physical Exam Updated Vital Signs BP (!) 177/82 (BP Location: Left Arm)    Pulse 65    Temp 98.3 F (36.8 C) (Oral)    Resp 18    Ht 5\' 2"  (1.575 m)    Wt 72.6 kg    LMP  (LMP Unknown)    SpO2 99%    BMI 29.26 kg/m  Physical Exam Vitals and nursing note reviewed.  Constitutional:      General: She is not in acute distress.    Appearance: She is well-developed.  HENT:     Head: Normocephalic and atraumatic.  Eyes:     Conjunctiva/sclera: Conjunctivae normal.  Cardiovascular:     Rate and Rhythm: Normal rate and regular rhythm.     Heart sounds: Normal heart sounds. No murmur heard. Pulmonary:     Effort: Pulmonary effort is normal. No respiratory distress.     Breath sounds: Normal breath sounds.  Abdominal:     Palpations: Abdomen is soft.     Tenderness: There is no abdominal tenderness.  Musculoskeletal:        General: No swelling.     Cervical back: Neck  supple.     Right lower leg: No tenderness. No edema.     Left lower leg: No tenderness. No edema.  Skin:    General: Skin is warm and dry.     Capillary Refill: Capillary refill takes less than 2 seconds.  Neurological:     General: No focal deficit  present.     Mental Status: She is alert.  Psychiatric:        Mood and Affect: Mood normal.    ED Results / Procedures / Treatments   Labs (all labs ordered are listed, but only abnormal results are displayed) Labs Reviewed  BASIC METABOLIC PANEL  CBC  TROPONIN I (HIGH SENSITIVITY)  TROPONIN I (HIGH SENSITIVITY)    EKG EKG Interpretation  Date/Time:  Friday November 09 2021 16:10:38 EST Ventricular Rate:  62 PR Interval:  162 QRS Duration: 80 QT Interval:  418 QTC Calculation: 424 R Axis:   81 Text Interpretation: Normal sinus rhythm Septal infarct , age undetermined Abnormal ECG No previous ECGs available Confirmed by Aletta Edouard [phone]) on 11/09/2021 4:21:28 PM  Radiology DG Chest 2 View  Result Date: 11/09/2021 CLINICAL DATA:  Chest pain EXAM: CHEST - 2 VIEW COMPARISON:  None. FINDINGS: Normal mediastinum and cardiac silhouette. Normal pulmonary vasculature. No evidence of effusion, infiltrate, or pneumothorax. No acute bony abnormality. IMPRESSION: No acute cardiopulmonary process. Electronically Signed   By: Suzy Bouchard M.D.   On: 11/09/2021 16:29    Procedures Procedures    Medications Ordered in ED Medications - No data to display  ED Course/ Medical Decision Making/ A&P                           Medical Decision Making  This patient complains of 30 minutes of chest tightness; this involves an extensive number of treatment Options and is a complaint that carries with it a high risk of complications and Morbidity. The differential includes ACS, pneumonia, PE, reflux, musculoskeletal, vascular  I ordered, reviewed and interpreted labs, which included CBC with normal white count normal hemoglobin,  chemistries normal, delta troponin flat I ordered imaging studies which included chest x-ray and I independently    visualized and interpreted imaging which showed no acute findings Additional history obtained from patient's spouse Previous records obtained and reviewed in epic including prior cardiology notes  After the interventions stated above, I reevaluated the patient and found patient to be remaining pain-free.  Work-up does not show an acute ACS.  She is comfortable plan for outpatient follow-up with her treating providers.  Return instructions discussed         Final Clinical Impression(s) / ED Diagnoses Final diagnoses:  Nonspecific chest pain    Rx / DC Orders ED Discharge Orders     None         Hayden Rasmussen, MD 11/10/21 [phone]

## 2023-01-07 ENCOUNTER — Emergency Department (HOSPITAL_BASED_OUTPATIENT_CLINIC_OR_DEPARTMENT_OTHER)
Admission: EM | Admit: 2023-01-07 | Discharge: 2023-01-07 | Disposition: A | Discharge status: Home / Self Care | Payer: Medicare HMO | Attending: Emergency Medicine | Admitting: Emergency Medicine

## 2023-01-07 ENCOUNTER — Emergency Department (HOSPITAL_BASED_OUTPATIENT_CLINIC_OR_DEPARTMENT_OTHER): Payer: Medicare HMO

## 2023-01-07 ENCOUNTER — Encounter (HOSPITAL_BASED_OUTPATIENT_CLINIC_OR_DEPARTMENT_OTHER): Payer: Self-pay

## 2023-01-07 DIAGNOSIS — R1310 Dysphagia, unspecified: Secondary | ICD-10-CM | POA: Insufficient documentation

## 2023-01-07 DIAGNOSIS — J3489 Other specified disorders of nose and nasal sinuses: Secondary | ICD-10-CM | POA: Diagnosis not present

## 2023-01-07 DIAGNOSIS — J392 Other diseases of pharynx: Secondary | ICD-10-CM | POA: Insufficient documentation

## 2023-01-07 DIAGNOSIS — Z7982 Long term (current) use of aspirin: Secondary | ICD-10-CM | POA: Insufficient documentation

## 2023-01-07 HISTORY — DX: Acute epiglottitis without obstruction: J05.10

## 2023-01-07 LAB — CBC WITH DIFFERENTIAL/PLATELET
Abs Immature Granulocytes: 0.01 10*3/uL (ref 0.00–0.07)
Basophils Absolute: 0.1 10*3/uL (ref 0.0–0.1)
Basophils Relative: 1 %
Eosinophils Absolute: 0.3 10*3/uL (ref 0.0–0.5)
Eosinophils Relative: 6 %
HCT: 36 % (ref 36.0–46.0)
Hemoglobin: 12.1 g/dL (ref 12.0–15.0)
Immature Granulocytes: 0 %
Lymphocytes Relative: 32 %
Lymphs Abs: 1.4 10*3/uL (ref 0.7–4.0)
MCH: 31.2 pg (ref 26.0–34.0)
MCHC: 33.6 g/dL (ref 30.0–36.0)
MCV: 92.8 fL (ref 80.0–100.0)
Monocytes Absolute: 0.4 10*3/uL (ref 0.1–1.0)
Monocytes Relative: 8 %
Neutro Abs: 2.3 10*3/uL (ref 1.7–7.7)
Neutrophils Relative %: 53 %
Platelets: 334 10*3/uL (ref 150–400)
RBC: 3.88 MIL/uL (ref 3.87–5.11)
RDW: 13 % (ref 11.5–15.5)
WBC: 4.4 10*3/uL (ref 4.0–10.5)
nRBC: 0 % (ref 0.0–0.2)

## 2023-01-07 LAB — COMPREHENSIVE METABOLIC PANEL
ALT: 19 U/L (ref 0–44)
AST: 27 U/L (ref 15–41)
Albumin: 3.8 g/dL (ref 3.5–5.0)
Alkaline Phosphatase: 43 U/L (ref 38–126)
Anion gap: 7 (ref 5–15)
BUN: 25 mg/dL — ABNORMAL HIGH (ref 8–23)
CO2: 24 mmol/L (ref 22–32)
Calcium: 8.3 mg/dL — ABNORMAL LOW (ref 8.9–10.3)
Chloride: 104 mmol/L (ref 98–111)
Creatinine, Ser: 0.91 mg/dL (ref 0.44–1.00)
GFR, Estimated: >60 mL/min (ref >60)
Glucose, Bld: 120 mg/dL — ABNORMAL HIGH (ref 70–99)
Potassium: 4.1 mmol/L (ref 3.5–5.1)
Sodium: 135 mmol/L (ref 135–145)
Total Bilirubin: 0.7 mg/dL (ref 0.3–1.2)
Total Protein: 6.3 g/dL — ABNORMAL LOW (ref 6.5–8.1)

## 2023-01-07 MED ORDER — METHYLPREDNISOLONE 4 MG PO TBPK: ORAL_TABLET | ORAL | 0 refills | Status: AC

## 2023-01-07 MED ORDER — SODIUM CHLORIDE 0.9 % IV SOLN
3.0000 g | Freq: Once | INTRAVENOUS | Status: AC
  Administered 2023-01-07: 3 g via INTRAVENOUS

## 2023-01-07 MED ORDER — IOHEXOL 300 MG/ML  SOLN
100.0000 mL | Freq: Once | INTRAMUSCULAR | Status: AC | PRN
  Administered 2023-01-07: 75 mL via INTRAVENOUS

## 2023-01-07 MED ORDER — AMOXICILLIN-POT CLAVULANATE 875-125 MG PO TABS: 1.0000 | ORAL_TABLET | Freq: Two times a day (BID) | ORAL | 0 refills | Status: AC

## 2023-01-07 MED ORDER — DEXAMETHASONE SODIUM PHOSPHATE 10 MG/ML IJ SOLN
20.0000 mg | Freq: Once | INTRAMUSCULAR | Status: AC
  Administered 2023-01-07: 20 mg via INTRAVENOUS
  Filled 2023-01-07: qty 2

## 2023-01-07 NOTE — Discharge Instructions (Addendum)
Go to [facility] if any problems.

## 2023-01-07 NOTE — ED Triage Notes (Signed)
Pt c/o difficulty swallowing swallowing that started this morning just PTA. 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

## 2023-01-07 NOTE — ED Notes (Signed)
Report rec'd from prev RN 

## 2023-01-07 NOTE — ED Triage Notes (Signed)
Pt reports she was in Afib last night that has resolved.

## 2023-01-07 NOTE — ED Provider Notes (Signed)
[place] [facility] [place] Provider Note   CSN: [id number] Arrival date & time: 01/07/23  1100     History  Chief Complaint  Patient presents with   Dysphagia    Olivia Lawrence is a 67 y.o. female.  Pt complains of difficulty swallowing.  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  Patient reports she has the same sensation that she had in March 2020 when she was diagnosed with acute epiglottitis.  Patient reports she is not having any difficulty swallowing she has not had any fever or chills.  Patient denies any recent illness.  Patient denies any fever or chills she denies any cough or congestion.  Patient denies any sore throat  The history is provided by the patient. No language interpreter was used.       Home Medications Prior to Admission medications   Medication Sig Start Date End Date Taking? Authorizing Provider  amLODipine (NORVASC) 10 MG tablet Take 1 tablet (10 mg total) by mouth daily. 07/30/20 07/30/21  Florencia Reasons, MD  ascorbic acid (VITAMIN C) 500 MG tablet Take 500 mg by mouth daily.    [provider]  aspirin EC 81 MG tablet Take 81 mg by mouth daily with supper.     [provider]  Bempedoic Acid (NEXLETOL) 180 MG TABS Take 180 mg by mouth daily with breakfast.    [provider]  carvedilol (COREG) 12.5 MG tablet Take 12.5 mg by mouth 2 (two) times daily with a meal.    [provider]  cetirizine (ZYRTEC) 10 MG tablet Take 10 mg by mouth daily.    [provider]  Cholecalciferol (VITAMIN D-3) 125 MCG (5000 UT) TABS Take 5,000 Units by mouth daily.    [provider]  Cinnamon 500 MG TABS Take 500 mg by mouth daily.    [provider]  escitalopram (LEXAPRO) 20 MG tablet Take 20 mg by mouth daily with supper.    [provider]  Evolocumab (REPATHA) 140 MG/ML SOSY Inject 140 mg into the skin every 14 (fourteen) days.    [provider]   famotidine (PEPCID) 10 MG tablet Take 1 tablet (10 mg total) by mouth daily for 4 days. 07/30/20 08/03/20  Florencia Reasons, MD  flecainide (TAMBOCOR) 50 MG tablet Take 50 mg by mouth 2 (two) times daily with a meal.     [provider]  Ginkgo Biloba 120 MG CAPS Take 120 mg by mouth daily.    [provider]  ibuprofen (ADVIL) 200 MG tablet Take 600 mg by mouth every 6 (six) hours as needed for fever or headache (pain).    [provider]  Lecithin 1000 MG CHEW Chew 1,000 mg by mouth daily.    [provider]  levothyroxine (SYNTHROID, LEVOTHROID) 88 MCG tablet Take 88 mcg by mouth daily before breakfast.    [provider]  LORazepam (ATIVAN) 0.5 MG tablet Take 0.5 mg by mouth daily as needed for anxiety.    [provider]  meloxicam (MOBIC) 15 MG tablet Take 15 mg by mouth daily with breakfast.     [provider]  Multiple Minerals (MULTI-MINERALS) TABS Take 1 tablet by mouth daily.    [provider]  nitroGLYCERIN (NITROSTAT) 0.4 MG SL tablet Place 0.4 mg under the tongue every 5 (five) minutes as needed for chest pain. 06/13/17   [provider]  Omega-3 Fatty Acids (FISH OIL) 1000 MG CAPS Take 1,000  mg by mouth daily.    [provider]  omeprazole (PRILOSEC OTC) 20 MG tablet Take 20 mg by mouth daily as needed (acid reflux/indigestion).    [provider]  OVER THE COUNTER MEDICATION Take 1,000 mg by mouth daily. "EDTA"    [provider]  predniSONE (DELTASONE) 10 MG tablet Label  & dispense according to the schedule below. 4 Pills PO on day one, 3Pills PO on day two, 2 Pills PO on day three, 1 Pills PO on day four,  then STOP.  Total of 10 tabs 07/30/20   Florencia Reasons, MD      Allergies    Dilaudid [hydromorphone hcl] and Zetia [ezetimibe]    Review of Systems   Review of Systems  HENT:  Positive for rhinorrhea. Negative for congestion, trouble swallowing and voice change.   All other  systems reviewed and are negative.   Physical Exam Updated Vital Signs BP (!) 194/88 (BP Location: Right Arm)   Pulse 62   Temp 98.6 F (37 C) (Oral)   Resp (!) 22   Ht '5\' 2"'$  (1.575 m)   Wt 78 kg   LMP  (LMP Unknown)   SpO2 99%   BMI 31.46 kg/m  Physical Exam Vitals and nursing note reviewed.  Constitutional:      Appearance: She is well-developed.  HENT:     Head: Normocephalic.     Mouth/Throat:     Mouth: Mucous membranes are moist.  Eyes:     Pupils: Pupils are equal, round, and reactive to light.  Cardiovascular:     Rate and Rhythm: Normal rate.     Pulses: Normal pulses.     Heart sounds: Normal heart sounds.  Pulmonary:     Effort: Pulmonary effort is normal.  Abdominal:     General: There is no distension.  Musculoskeletal:        General: Normal range of motion.     Cervical back: Normal range of motion.  Skin:    General: Skin is warm.  Neurological:     General: No focal deficit present.     Mental Status: She is alert and oriented to person, place, and time.  Psychiatric:        Mood and Affect: Mood normal.     ED Results / Procedures / Treatments   Labs (all labs ordered are listed, but only abnormal results are displayed) Labs Reviewed  COMPREHENSIVE METABOLIC PANEL - Abnormal; Notable for the following components:      Result Value   Glucose, Bld 120 (*)    BUN 25 (*)    Calcium 8.3 (*)    Total Protein 6.3 (*)    All other components within normal limits  CBC WITH DIFFERENTIAL/PLATELET    EKG EKG Interpretation  Date/Time:  Tuesday January 07 2023 11:17:05 EDT Ventricular Rate:  58 PR Interval:  184 QRS Duration: 96 QT Interval:  423 QTC Calculation: 416 R Axis:   83 Text Interpretation: Sinus rhythm No significant change since last tracing Confirmed by Regan Lemming ([id number]) on 01/07/2023 12:06:38 PM  Radiology CT Soft Tissue Neck W Contrast  Result Date: 01/07/2023 CLINICAL DATA:  Epiglottitis or tonsillitis suspected. EXAM:  CT NECK WITH CONTRAST TECHNIQUE: Multidetector CT imaging of the neck was performed using the standard protocol following the bolus administration of intravenous contrast. RADIATION DOSE REDUCTION: This exam was performed according to the departmental dose-optimization program which includes automated exposure control, adjustment of the mA and/or kV according to  patient size and/or use of iterative reconstruction technique. CONTRAST:  56m OMNIPAQUE IOHEXOL 300 MG/ML  SOLN COMPARISON:  07/29/2020 and 01/11/2019 FINDINGS: Pharynx and larynx: Submucosal low-density thickening of the right supraglottic larynx especially of the aryepiglottic fold. No collection. Salivary glands: No inflammation, mass, or stone. Thyroid: Normal. Lymph nodes: None enlarged or abnormal density. Vascular: Extensive atheromatous calcification with mild or moderate narrowing at the proximal left subclavian. Limited intracranial: Negative Visualized orbits: Negative Mastoids and visualized paranasal sinuses: Head clear Skeleton: Ordinary cervical spine degeneration. Upper chest: Negative IMPRESSION: Supraglottitis asymmetrically affecting the right. The swelling is less severe than seen in 2020 in 2021, is there history of angioedema? No abscess. Electronically Signed   By: JJorje GuildM.D.   On: 01/07/2023 12:34    Procedures Procedures    Medications Ordered in ED Medications  iohexol (OMNIPAQUE) 300 MG/ML solution 100 mL (75 mLs Intravenous Contrast Given 01/07/23 1219)    ED Course/ Medical Decision Making/ A&P                             Medical Decision Making Had epiglottitis in 2020.  Patient reports she is currently having some difficulty swallowing and symptoms are similar to what she had when she had epiglottitis.  Patient states symptoms are not as bad this time  Amount and/or Complexity of Data Reviewed Labs: ordered. Decision-making details documented in ED Course.    Details: Labs ordered reviewed and  interpreted patient has a normal white blood cell count chemistries are normal Radiology: ordered and independent interpretation performed. Decision-making details documented in ED Course.    Details: CT scan shows super glottitis asymmetry effecting the right.  Radiologist reports swelling is less than in 2020. Discussion of management or test interpretation with external provider(s): I discussed the patient with Dr. HSabino GasserENT on-call for Dr. RConstance Holster  He reviewed patient's current CT scan and previous CT scans.  He advised he does not feel this is epiglottitis.  He agreed with the Decadron and Unasyn here in the emergency department he advised Augmentin for 10 days and Medrol Dosepak.  He advised to give the patient his office number.  He states that she is to call if symptoms worsen and that he can do a laryngoscopy in the office.  Risk Prescription drug management. Risk Details: Cussed results of CT scan laboratory evaluation with patient patient is given antibiotics and Decadron here.  Patient is in agreement with treatment plan.  Patient is discharged in stable condition with follow-up instructions           Final Clinical Impression(s) / ED Diagnoses Final diagnoses:  Dysphagia, unspecified type  Swelling of throat    Rx / DC Orders ED Discharge Orders          Ordered    amoxicillin-clavulanate (AUGMENTIN) 875-125 MG tablet  2 times daily        01/07/23 1414    methylPREDNISolone (MEDROL DOSEPAK) 4 MG TBPK tablet       Note to Pharmacy: Please provide medrol dose pack   01/07/23 1414           An After Visit Summary was printed and given to the patient.    SFransico Meadow PVermont03/12/24 1646    LRegan Lemming MD 01/07/23 [phone]

## 2023-01-07 NOTE — ED Notes (Signed)
Called Dr Janeice Robinson office and left message with receptionist at 1:26 on call will call back.

## 2023-03-14 IMAGING — DX DG CHEST 2V
2 series · 2 of 2 positions shown · non-contrast
Comparison: None.

CLINICAL DATA: Chest pain

EXAM:
CHEST - 2 VIEW

[chest pa]
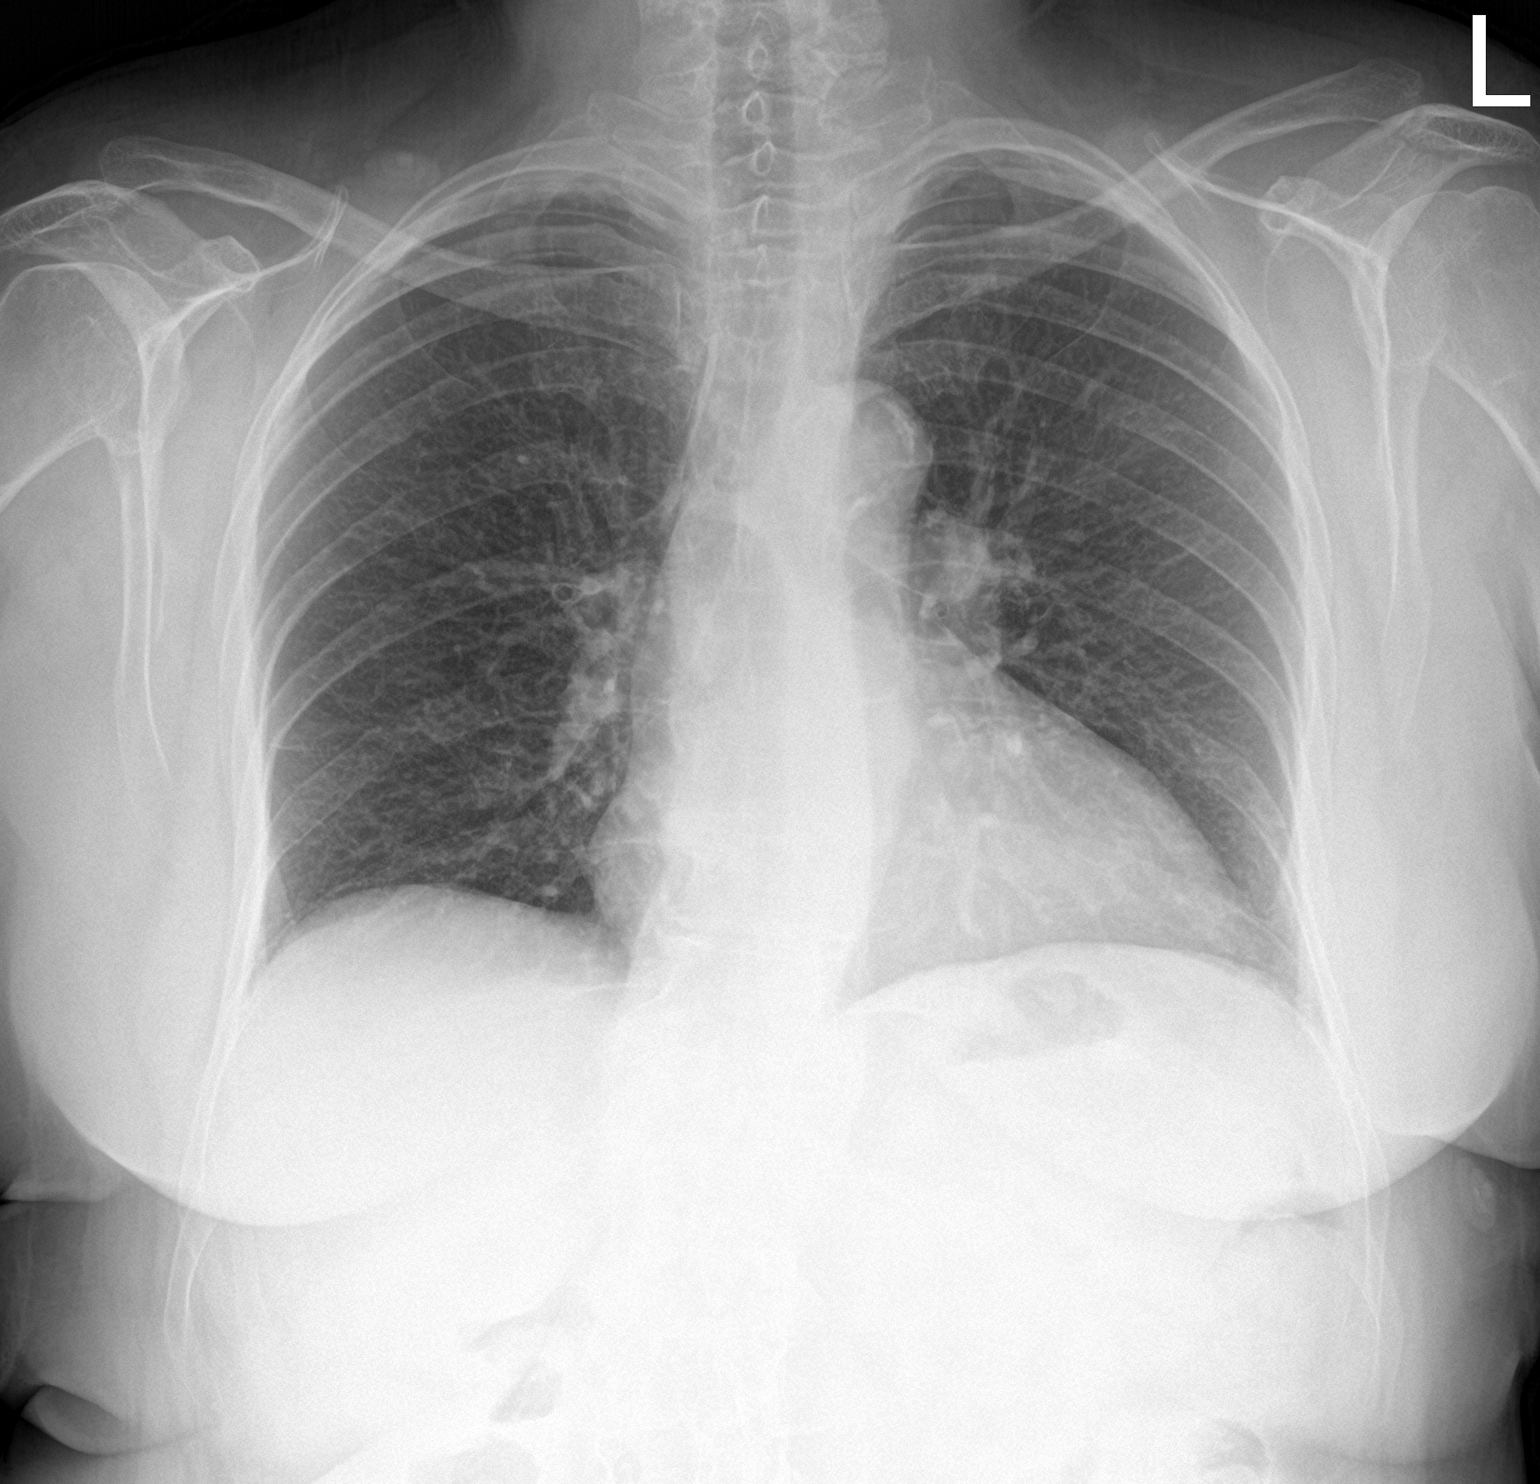

[chest lat]
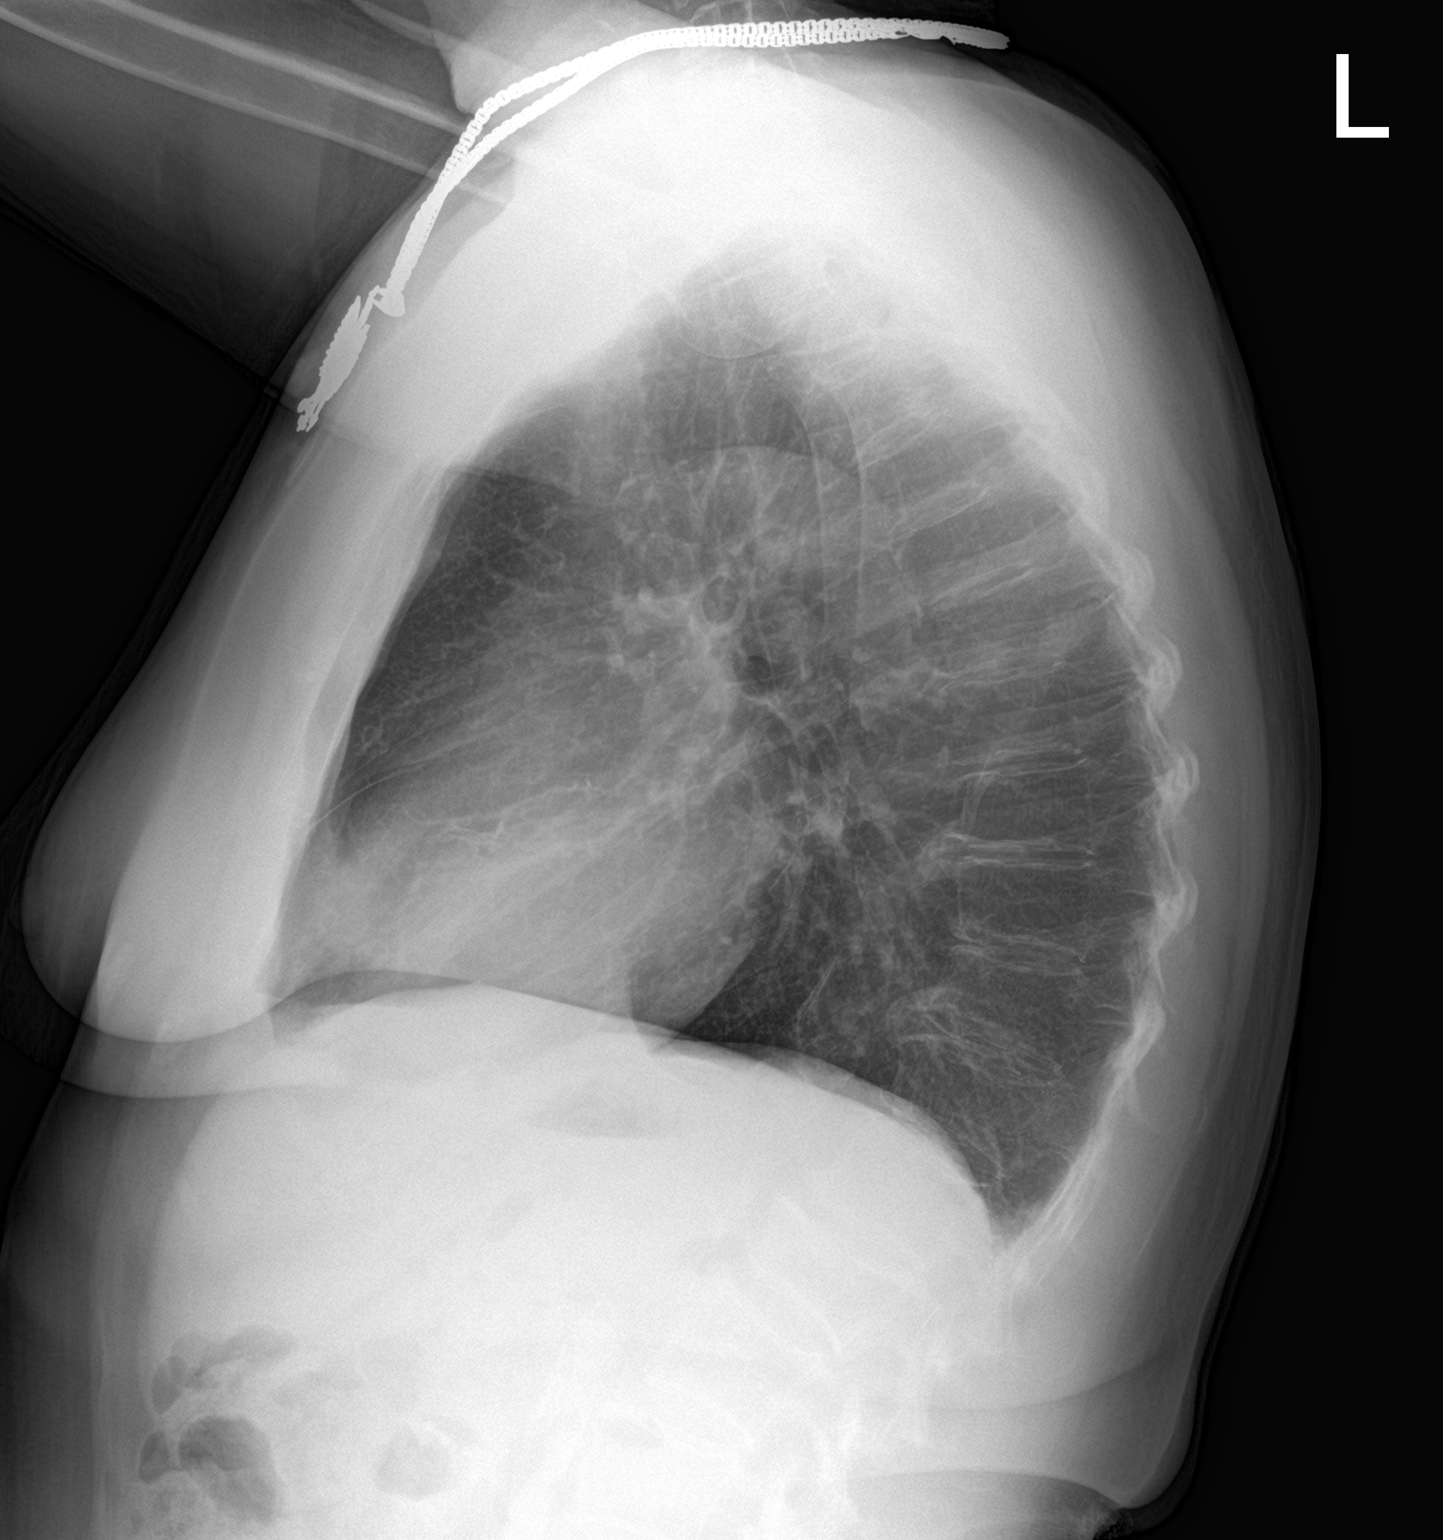

[2 of 2 positions shown; findings below may reference images not displayed]

FINDINGS: Normal mediastinum and cardiac silhouette. Normal pulmonary
vasculature. No evidence of effusion, infiltrate, or pneumothorax.
No acute bony abnormality.
IMPRESSION: No acute cardiopulmonary process.

## 2023-05-22 ENCOUNTER — Other Ambulatory Visit: Payer: Self-pay

## 2023-05-22 ENCOUNTER — Emergency Department (HOSPITAL_BASED_OUTPATIENT_CLINIC_OR_DEPARTMENT_OTHER)
Admission: EM | Admit: 2023-05-22 | Discharge: 2023-05-22 | Disposition: A | Discharge status: Home / Self Care | Payer: Medicare HMO | Attending: Emergency Medicine | Admitting: Emergency Medicine

## 2023-05-22 ENCOUNTER — Emergency Department (HOSPITAL_BASED_OUTPATIENT_CLINIC_OR_DEPARTMENT_OTHER): Payer: Medicare HMO

## 2023-05-22 ENCOUNTER — Encounter (HOSPITAL_BASED_OUTPATIENT_CLINIC_OR_DEPARTMENT_OTHER): Payer: Self-pay

## 2023-05-22 DIAGNOSIS — R519 Headache, unspecified: Secondary | ICD-10-CM | POA: Insufficient documentation

## 2023-05-22 DIAGNOSIS — M25511 Pain in right shoulder: Secondary | ICD-10-CM | POA: Insufficient documentation

## 2023-05-22 DIAGNOSIS — I2510 Atherosclerotic heart disease of native coronary artery without angina pectoris: Secondary | ICD-10-CM | POA: Diagnosis not present

## 2023-05-22 DIAGNOSIS — Y92410 Unspecified street and highway as the place of occurrence of the external cause: Secondary | ICD-10-CM | POA: Diagnosis not present

## 2023-05-22 DIAGNOSIS — Z7982 Long term (current) use of aspirin: Secondary | ICD-10-CM | POA: Diagnosis not present

## 2023-05-22 DIAGNOSIS — I10 Essential (primary) hypertension: Secondary | ICD-10-CM | POA: Diagnosis not present

## 2023-05-22 DIAGNOSIS — S161XXA Strain of muscle, fascia and tendon at neck level, initial encounter: Secondary | ICD-10-CM | POA: Insufficient documentation

## 2023-05-22 DIAGNOSIS — M542 Cervicalgia: Secondary | ICD-10-CM | POA: Diagnosis present

## 2023-05-22 DIAGNOSIS — Z79899 Other long term (current) drug therapy: Secondary | ICD-10-CM | POA: Insufficient documentation

## 2023-05-22 MED ORDER — CYCLOBENZAPRINE HCL 10 MG PO TABS
10.0000 mg | ORAL_TABLET | Freq: Once | ORAL | Status: AC
  Administered 2023-05-22: 10 mg via ORAL
  Filled 2023-05-22: qty 1

## 2023-05-22 MED ORDER — CYCLOBENZAPRINE HCL 10 MG PO TABS: 10.0000 mg | ORAL_TABLET | Freq: Two times a day (BID) | ORAL | 0 refills | Status: AC | PRN
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
## 2024-02-24 ENCOUNTER — Encounter (HOSPITAL_BASED_OUTPATIENT_CLINIC_OR_DEPARTMENT_OTHER): Payer: Self-pay | Admitting: Emergency Medicine

## 2024-02-24 ENCOUNTER — Emergency Department (HOSPITAL_BASED_OUTPATIENT_CLINIC_OR_DEPARTMENT_OTHER): Payer: Medicare (Managed Care)

## 2024-02-24 ENCOUNTER — Other Ambulatory Visit: Payer: Self-pay

## 2024-02-24 ENCOUNTER — Emergency Department (HOSPITAL_BASED_OUTPATIENT_CLINIC_OR_DEPARTMENT_OTHER)
Admission: EM | Admit: 2024-02-24 | Discharge: 2024-02-25 | Disposition: A | Discharge status: Home / Self Care | Payer: Medicare (Managed Care) | Attending: Emergency Medicine | Admitting: Emergency Medicine

## 2024-02-24 DIAGNOSIS — I2510 Atherosclerotic heart disease of native coronary artery without angina pectoris: Secondary | ICD-10-CM | POA: Insufficient documentation

## 2024-02-24 DIAGNOSIS — R079 Chest pain, unspecified: Secondary | ICD-10-CM | POA: Diagnosis present

## 2024-02-24 DIAGNOSIS — Z79899 Other long term (current) drug therapy: Secondary | ICD-10-CM | POA: Diagnosis not present

## 2024-02-24 DIAGNOSIS — I10 Essential (primary) hypertension: Secondary | ICD-10-CM | POA: Diagnosis not present

## 2024-02-24 DIAGNOSIS — M542 Cervicalgia: Secondary | ICD-10-CM | POA: Insufficient documentation

## 2024-02-24 DIAGNOSIS — I4891 Unspecified atrial fibrillation: Secondary | ICD-10-CM

## 2024-02-24 DIAGNOSIS — R002 Palpitations: Secondary | ICD-10-CM | POA: Insufficient documentation

## 2024-02-24 DIAGNOSIS — M79621 Pain in right upper arm: Secondary | ICD-10-CM | POA: Diagnosis not present

## 2024-02-24 DIAGNOSIS — R0602 Shortness of breath: Secondary | ICD-10-CM | POA: Insufficient documentation

## 2024-02-24 DIAGNOSIS — Z8616 Personal history of COVID-19: Secondary | ICD-10-CM | POA: Insufficient documentation

## 2024-02-24 DIAGNOSIS — Z7982 Long term (current) use of aspirin: Secondary | ICD-10-CM | POA: Diagnosis not present

## 2024-02-24 MED ORDER — DILTIAZEM LOAD VIA INFUSION
10.0000 mg | Freq: Once | INTRAVENOUS | Status: AC
  Administered 2024-02-25: 10 mg via INTRAVENOUS
  Filled 2024-02-24: qty 10

## 2024-02-24 MED ORDER — DILTIAZEM HCL-DEXTROSE 125-5 MG/125ML-% IV SOLN (PREMIX)
5.0000 mg/h | INTRAVENOUS | Status: DC
  Administered 2024-02-25: 5 mg/h via INTRAVENOUS
  Filled 2024-02-24: qty 125

## 2024-02-24 NOTE — ED Triage Notes (Signed)
 Pt states BP and HR has been all over the place with elevated HR started tonight.

## 2024-02-24 NOTE — ED Provider Notes (Signed)
 [place] [facility] AT [facility] Provider Note   CSN: [id number] Arrival date & time: 02/24/24  2333     History {Add pertinent medical, surgical, social history, OB history to HPI:1} Chief Complaint  Patient presents with   Irregular Heart Beat    Olivia Lawrence is a 68 y.o. female.  The history is provided by the patient.  Palpitations Palpitations quality:  Fast Onset quality:  Sudden Timing:  Constant Progression:  Unchanged Chronicity:  Recurrent Relieved by:  Nothing Worsened by:  Nothing Ineffective treatments:  None tried Associated symptoms: chest pain and shortness of breath   Risk factors: heart disease        Home Medications Prior to Admission medications   Medication Sig Start Date End Date Taking? Authorizing Provider  amLODipine  (NORVASC ) 10 MG tablet Take 1 tablet (10 mg total) by mouth daily. 07/30/20 07/30/21  Lavaughn Portland, MD  amoxicillin -clavulanate (AUGMENTIN ) 875-125 MG tablet Take 1 tablet by mouth 2 (two) times daily. 01/07/23   Sofia, Leslie K, PA-C  ascorbic acid (VITAMIN C) 500 MG tablet Take 500 mg by mouth daily.    [provider]  aspirin  EC 81 MG tablet Take 81 mg by mouth daily with supper.     [provider]  Bempedoic Acid (NEXLETOL) 180 MG TABS Take 180 mg by mouth daily with breakfast.    [provider]  carvedilol  (COREG ) 12.5 MG tablet Take 12.5 mg by mouth 2 (two) times daily with a meal.    [provider]  cetirizine (ZYRTEC) 10 MG tablet Take 10 mg by mouth daily.    [provider]  Cholecalciferol (VITAMIN D-3) 125 MCG (5000 UT) TABS Take 5,000 Units by mouth daily.    [provider]  Cinnamon 500 MG TABS Take 500 mg by mouth daily.    [provider]  cyclobenzaprine  (FLEXERIL ) 10 MG tablet Take 1 tablet (10 mg total) by mouth 2 (two) times daily as needed for muscle spasms. 05/22/23   Curatolo, Adam, DO  escitalopram  (LEXAPRO ) 20 MG tablet  Take 20 mg by mouth daily with supper.    [provider]  Evolocumab (REPATHA) 140 MG/ML SOSY Inject 140 mg into the skin every 14 (fourteen) days.    [provider]  famotidine  (PEPCID ) 10 MG tablet Take 1 tablet (10 mg total) by mouth daily for 4 days. 07/30/20 08/03/20  Lavaughn Portland, MD  flecainide  (TAMBOCOR ) 50 MG tablet Take 50 mg by mouth 2 (two) times daily with a meal.     [provider]  Ginkgo Biloba 120 MG CAPS Take 120 mg by mouth daily.    [provider]  ibuprofen (ADVIL) 200 MG tablet Take 600 mg by mouth every 6 (six) hours as needed for fever or headache (pain).    [provider]  Lecithin 1000 MG CHEW Chew 1,000 mg by mouth daily.    [provider]  levothyroxine  (SYNTHROID , LEVOTHROID) 88 MCG tablet Take 88 mcg by mouth daily before breakfast.    [provider]  LORazepam  (ATIVAN ) 0.5 MG tablet Take 0.5 mg by mouth daily as needed for anxiety.    [provider]  meloxicam (MOBIC) 15 MG tablet Take 15 mg by mouth daily with breakfast.     [provider]  methylPREDNISolone  (MEDROL  DOSEPAK) 4 MG TBPK tablet 6,5,4,3,2,1 01/07/23   Sofia, Leslie K, PA-C  Multiple Minerals (MULTI-MINERALS) TABS Take 1 tablet by mouth daily.    [provider]  nitroGLYCERIN  (NITROSTAT ) 0.4 MG SL tablet Place 0.4 mg under the tongue every 5 (five) minutes as needed for chest pain. 06/13/17   [provider]  Omega-3 Fatty Acids (FISH OIL) 1000 MG CAPS Take 1,000 mg by mouth daily.    [provider]  omeprazole  (PRILOSEC  OTC) 20 MG tablet Take 20 mg by mouth daily as needed (acid reflux/indigestion).    [provider]  OVER THE COUNTER MEDICATION Take 1,000 mg by mouth daily. "EDTA"    [provider]  predniSONE  (DELTASONE ) 10 MG tablet Label  & dispense according to the schedule below. 4 Pills PO on day one, 3Pills PO on day two, 2 Pills PO on day three, 1 Pills PO on day  four,  then STOP.  Total of 10 tabs 07/30/20   Lavaughn Portland, MD      Allergies    Dilaudid [hydromorphone hcl] and Zetia [ezetimibe]    Review of Systems   Review of Systems  Constitutional:  Negative for fever.  Respiratory:  Positive for shortness of breath.   Cardiovascular:  Positive for chest pain and palpitations.  All other systems reviewed and are negative.   Physical Exam Updated Vital Signs Ht 5\' 1"  (1.549 m)   Wt 74.4 kg   LMP  (LMP Unknown)   BMI 30.99 kg/m  Physical Exam Vitals and nursing note reviewed.  Constitutional:      General: She is not in acute distress.    Appearance: Normal appearance. She is well-developed.  HENT:     Head: Normocephalic and atraumatic.     Nose: Nose normal.  Eyes:     Pupils: Pupils are equal, round, and reactive to light.  Cardiovascular:     Rate and Rhythm: Tachycardia present. Rhythm irregular.     Pulses: Normal pulses.     Heart sounds: Normal heart sounds.  Pulmonary:     Effort: Pulmonary effort is normal. No respiratory distress.     Breath sounds: Normal breath sounds.  Abdominal:     General: Bowel sounds are normal. There is no distension.     Palpations: Abdomen is soft.     Tenderness: There is no abdominal tenderness. There is no guarding or rebound.  Musculoskeletal:        General: Normal range of motion.     Cervical back: Neck supple.  Skin:    General: Skin is warm and dry.     Capillary Refill: Capillary refill takes less than 2 seconds.     Findings: No erythema or rash.  Neurological:     General: No focal deficit present.     Mental Status: She is alert and oriented to person, place, and time.     Deep Tendon Reflexes: Reflexes normal.  Psychiatric:        Mood and Affect: Mood normal.     ED Results / Procedures / Treatments   Labs (all labs ordered are listed, but only abnormal results are displayed) Labs Reviewed  CBC WITH DIFFERENTIAL/PLATELET  BASIC METABOLIC PANEL WITH GFR  TROPONIN  T, HIGH SENSITIVITY    EKG  EKG Interpretation Date/Time:  Tuesday Foy Vanduyne 29 2025 23:46:58 EDT Ventricular Rate:  118 PR Interval:    QRS Duration:  93 QT Interval:  347 QTC Calculation: 487 R Axis:   80  Text Interpretation: Atrial fibrillation Confirmed by Christelle Igoe ([id number]) on 02/24/2024 11:47:45 PM         Radiology No results found.  Procedures Procedures  {  Document cardiac monitor, telemetry assessment procedure when appropriate:1}  Medications Ordered in ED Medications - No data to display  ED Course/ Medical Decision Making/ A&P   {   Click here for ABCD2, HEART and other calculatorsREFRESH Note before signing :1}                              Medical Decision Making Amount and/or Complexity of Data Reviewed Labs: ordered. Radiology: ordered.    Final Clinical Impression(s) / ED Diagnoses Final diagnoses:  None    Rx / DC Orders ED Discharge Orders     None

## 2024-02-25 ENCOUNTER — Emergency Department (HOSPITAL_BASED_OUTPATIENT_CLINIC_OR_DEPARTMENT_OTHER): Payer: Medicare (Managed Care)

## 2024-02-25 LAB — CBC WITH DIFFERENTIAL/PLATELET
Abs Immature Granulocytes: 0.01 10*3/uL (ref 0.00–0.07)
Basophils Absolute: 0.1 10*3/uL (ref 0.0–0.1)
Basophils Relative: 1 %
Eosinophils Absolute: 0.2 10*3/uL (ref 0.0–0.5)
Eosinophils Relative: 4 %
HCT: 35.3 % — ABNORMAL LOW (ref 36.0–46.0)
Hemoglobin: 11.9 g/dL — ABNORMAL LOW (ref 12.0–15.0)
Immature Granulocytes: 0 %
Lymphocytes Relative: 40 %
Lymphs Abs: 2.2 10*3/uL (ref 0.7–4.0)
MCH: 31.2 pg (ref 26.0–34.0)
MCHC: 33.7 g/dL (ref 30.0–36.0)
MCV: 92.7 fL (ref 80.0–100.0)
Monocytes Absolute: 0.5 10*3/uL (ref 0.1–1.0)
Monocytes Relative: 9 %
Neutro Abs: 2.6 10*3/uL (ref 1.7–7.7)
Neutrophils Relative %: 46 %
Platelets: 293 10*3/uL (ref 150–400)
RBC: 3.81 MIL/uL — ABNORMAL LOW (ref 3.87–5.11)
RDW: 13.5 % (ref 11.5–15.5)
WBC: 5.5 10*3/uL (ref 4.0–10.5)
nRBC: 0 % (ref 0.0–0.2)

## 2024-02-25 LAB — BASIC METABOLIC PANEL WITH GFR
Anion gap: 17 — ABNORMAL HIGH (ref 5–15)
BUN: 20 mg/dL (ref 8–23)
CO2: 19 mmol/L — ABNORMAL LOW (ref 22–32)
Calcium: 9.2 mg/dL (ref 8.9–10.3)
Chloride: 104 mmol/L (ref 98–111)
Creatinine, Ser: 0.97 mg/dL (ref 0.44–1.00)
GFR, Estimated: >60 mL/min (ref >60)
Glucose, Bld: 127 mg/dL — ABNORMAL HIGH (ref 70–99)
Potassium: 3.8 mmol/L (ref 3.5–5.1)
Sodium: 140 mmol/L (ref 135–145)

## 2024-02-25 LAB — TROPONIN T, HIGH SENSITIVITY
Troponin T High Sensitivity: 19 ng/L — ABNORMAL HIGH (ref <19)
Troponin T High Sensitivity: 19 ng/L — ABNORMAL HIGH (ref <19)

## 2024-02-25 MED ORDER — IOHEXOL 350 MG/ML SOLN
75.0000 mL | Freq: Once | INTRAVENOUS | Status: AC | PRN
  Administered 2024-02-25: 75 mL via INTRAVENOUS

## 2024-02-25 MED ORDER — SODIUM CHLORIDE 0.9 % IV BOLUS
500.0000 mL | Freq: Once | INTRAVENOUS | Status: AC
  Administered 2024-02-25: 500 mL via INTRAVENOUS

## 2024-03-05 ENCOUNTER — Emergency Department (HOSPITAL_BASED_OUTPATIENT_CLINIC_OR_DEPARTMENT_OTHER): Payer: Medicare (Managed Care)

## 2024-03-05 ENCOUNTER — Encounter (HOSPITAL_BASED_OUTPATIENT_CLINIC_OR_DEPARTMENT_OTHER): Payer: Self-pay

## 2024-03-05 ENCOUNTER — Emergency Department (HOSPITAL_BASED_OUTPATIENT_CLINIC_OR_DEPARTMENT_OTHER)
Admission: EM | Admit: 2024-03-05 | Discharge: 2024-03-05 | Disposition: A | Discharge status: Home / Self Care | Payer: Medicare (Managed Care)

## 2024-03-05 DIAGNOSIS — R000 Tachycardia, unspecified: Secondary | ICD-10-CM | POA: Diagnosis present

## 2024-03-05 DIAGNOSIS — Z79899 Other long term (current) drug therapy: Secondary | ICD-10-CM | POA: Insufficient documentation

## 2024-03-05 DIAGNOSIS — Z7901 Long term (current) use of anticoagulants: Secondary | ICD-10-CM | POA: Diagnosis not present

## 2024-03-05 DIAGNOSIS — I10 Essential (primary) hypertension: Secondary | ICD-10-CM | POA: Diagnosis not present

## 2024-03-05 DIAGNOSIS — Z7982 Long term (current) use of aspirin: Secondary | ICD-10-CM | POA: Diagnosis not present

## 2024-03-05 DIAGNOSIS — I2510 Atherosclerotic heart disease of native coronary artery without angina pectoris: Secondary | ICD-10-CM | POA: Diagnosis not present

## 2024-03-05 DIAGNOSIS — I480 Paroxysmal atrial fibrillation: Secondary | ICD-10-CM | POA: Insufficient documentation

## 2024-03-05 LAB — CBC
HCT: 37 % (ref 36.0–46.0)
Hemoglobin: 12.2 g/dL (ref 12.0–15.0)
MCH: 30.6 pg (ref 26.0–34.0)
MCHC: 33 g/dL (ref 30.0–36.0)
MCV: 92.7 fL (ref 80.0–100.0)
Platelets: 327 10*3/uL (ref 150–400)
RBC: 3.99 MIL/uL (ref 3.87–5.11)
RDW: 13.5 % (ref 11.5–15.5)
WBC: 5.3 10*3/uL (ref 4.0–10.5)
nRBC: 0 % (ref 0.0–0.2)

## 2024-03-05 LAB — BASIC METABOLIC PANEL WITH GFR
Anion gap: 15 (ref 5–15)
BUN: 21 mg/dL (ref 8–23)
CO2: 24 mmol/L (ref 22–32)
Calcium: 9.7 mg/dL (ref 8.9–10.3)
Chloride: 103 mmol/L (ref 98–111)
Creatinine, Ser: 1.03 mg/dL — ABNORMAL HIGH (ref 0.44–1.00)
GFR, Estimated: 58 mL/min — ABNORMAL LOW (ref >60)
Glucose, Bld: 113 mg/dL — ABNORMAL HIGH (ref 70–99)
Potassium: 4.3 mmol/L (ref 3.5–5.1)
Sodium: 142 mmol/L (ref 135–145)

## 2024-03-05 LAB — TROPONIN T, HIGH SENSITIVITY: Troponin T High Sensitivity: 17 ng/L (ref <19)

## 2024-03-05 MED ORDER — CARVEDILOL 12.5 MG PO TABS
25.0000 mg | ORAL_TABLET | Freq: Once | ORAL | Status: AC
  Administered 2024-03-05: 25 mg via ORAL
  Filled 2024-03-05: qty 2

## 2024-03-05 MED ORDER — METOPROLOL TARTRATE 5 MG/5ML IV SOLN
5.0000 mg | Freq: Once | INTRAVENOUS | Status: AC
  Administered 2024-03-05: 5 mg via INTRAVENOUS
  Filled 2024-03-05: qty 5

## 2024-03-05 NOTE — ED Provider Notes (Signed)
 [place] [facility] AT [facility] Provider Note   CSN: [id number] Arrival date & time: 03/05/24  0710     History  Chief Complaint  Patient presents with   Tachycardia    Olivia Lawrence is a 68 y.o. female.  This is a 68 year old female presenting emergency department for tachycardia.  Has a history of atrial fibrillation on Eliquis and Coreg .  Has had increasing episodes of atrial fibrillation.  Was due for stress test today, in preparation did not take her metoprolol .  She noted her heart rate was elevated this morning as well as her blood pressure.  She states she feels palpitations, but not having chest pain.  Some minor chest tightness/shortness of breath.  Low risk for PE based on Wells criteria.  No preceding infectious type symptoms.        Home Medications Prior to Admission medications   Medication Sig Start Date End Date Taking? Authorizing Provider  amLODipine  (NORVASC ) 10 MG tablet Take 1 tablet (10 mg total) by mouth daily. 07/30/20 07/30/21  Lavaughn Portland, MD  amoxicillin -clavulanate (AUGMENTIN ) 875-125 MG tablet Take 1 tablet by mouth 2 (two) times daily. 01/07/23   Sofia, Leslie K, PA-C  ascorbic acid (VITAMIN C) 500 MG tablet Take 500 mg by mouth daily.    [provider]  aspirin  EC 81 MG tablet Take 81 mg by mouth daily with supper.     [provider]  Bempedoic Acid (NEXLETOL) 180 MG TABS Take 180 mg by mouth daily with breakfast.    [provider]  carvedilol  (COREG ) 12.5 MG tablet Take 12.5 mg by mouth 2 (two) times daily with a meal.    [provider]  cetirizine (ZYRTEC) 10 MG tablet Take 10 mg by mouth daily.    [provider]  Cholecalciferol (VITAMIN D-3) 125 MCG (5000 UT) TABS Take 5,000 Units by mouth daily.    [provider]  Cinnamon 500 MG TABS Take 500 mg by mouth daily.    [provider]  cyclobenzaprine  (FLEXERIL ) 10 MG tablet Take 1 tablet (10 mg total) by mouth  2 (two) times daily as needed for muscle spasms. 05/22/23   Curatolo, Adam, DO  escitalopram  (LEXAPRO ) 20 MG tablet Take 20 mg by mouth daily with supper.    [provider]  Evolocumab (REPATHA) 140 MG/ML SOSY Inject 140 mg into the skin every 14 (fourteen) days.    [provider]  famotidine  (PEPCID ) 10 MG tablet Take 1 tablet (10 mg total) by mouth daily for 4 days. 07/30/20 08/03/20  Lavaughn Portland, MD  flecainide  (TAMBOCOR ) 50 MG tablet Take 50 mg by mouth 2 (two) times daily with a meal.     [provider]  Ginkgo Biloba 120 MG CAPS Take 120 mg by mouth daily.    [provider]  ibuprofen (ADVIL) 200 MG tablet Take 600 mg by mouth every 6 (six) hours as needed for fever or headache (pain).    [provider]  Lecithin 1000 MG CHEW Chew 1,000 mg by mouth daily.    [provider]  levothyroxine  (SYNTHROID , LEVOTHROID) 88 MCG tablet Take 88 mcg by mouth daily before breakfast.    [provider]  LORazepam  (ATIVAN ) 0.5 MG tablet Take 0.5 mg by mouth daily as needed for anxiety.    [provider]  meloxicam (MOBIC) 15 MG tablet Take 15 mg by mouth daily with breakfast.     [provider]  methylPREDNISolone  (MEDROL  DOSEPAK)  4 MG TBPK tablet 6,5,4,3,2,1 01/07/23   Sandi Crosby, PA-C  Multiple Minerals (MULTI-MINERALS) TABS Take 1 tablet by mouth daily.    [provider]  nitroGLYCERIN  (NITROSTAT ) 0.4 MG SL tablet Place 0.4 mg under the tongue every 5 (five) minutes as needed for chest pain. 06/13/17   [provider]  Omega-3 Fatty Acids (FISH OIL) 1000 MG CAPS Take 1,000 mg by mouth daily.    [provider]  omeprazole  (PRILOSEC  OTC) 20 MG tablet Take 20 mg by mouth daily as needed (acid reflux/indigestion).    [provider]  OVER THE COUNTER MEDICATION Take 1,000 mg by mouth daily. "EDTA"    [provider]  predniSONE  (DELTASONE ) 10 MG tablet Label  & dispense according  to the schedule below. 4 Pills PO on day one, 3Pills PO on day two, 2 Pills PO on day three, 1 Pills PO on day four,  then STOP.  Total of 10 tabs 07/30/20   Lavaughn Portland, MD      Allergies    Dilaudid [hydromorphone hcl] and Zetia [ezetimibe]    Review of Systems   Review of Systems  Physical Exam Updated Vital Signs BP (!) 161/97   Pulse 91   Temp 98.5 F (36.9 C) (Oral)   Resp 16   Ht 5\' 1"  (1.549 m)   Wt 74.4 kg   LMP  (LMP Unknown)   SpO2 96%   BMI 30.99 kg/m  Physical Exam Vitals and nursing note reviewed.  Constitutional:      General: She is not in acute distress.    Appearance: She is not toxic-appearing.  HENT:     Mouth/Throat:     Mouth: Mucous membranes are moist.  Eyes:     Conjunctiva/sclera: Conjunctivae normal.  Cardiovascular:     Rate and Rhythm: Tachycardia present. Rhythm irregular.     Pulses: Normal pulses.  Pulmonary:     Effort: Pulmonary effort is normal.  Abdominal:     General: Abdomen is flat. There is no distension.     Tenderness: There is no abdominal tenderness. There is no guarding or rebound.  Skin:    General: Skin is warm and dry.     Capillary Refill: Capillary refill takes less than 2 seconds.  Neurological:     Mental Status: She is alert and oriented to person, place, and time.  Psychiatric:        Mood and Affect: Mood normal.        Behavior: Behavior normal.     ED Results / Procedures / Treatments   Labs (all labs ordered are listed, but only abnormal results are displayed) Labs Reviewed  BASIC METABOLIC PANEL WITH GFR - Abnormal; Notable for the following components:      Result Value   Glucose, Bld 113 (*)    Creatinine, Ser 1.03 (*)    GFR, Estimated 58 (*)    All other components within normal limits  CBC  TROPONIN T, HIGH SENSITIVITY    EKG EKG Interpretation Date/Time:  Friday Mar 05 2024 08:09:13 EDT Ventricular Rate:  65 PR Interval:  195 QRS Duration:  98 QT Interval:  423 QTC Calculation: 440 R  Axis:   69  Text Interpretation: Sinus rhythm Confirmed by Elise Guile [phone]) on 03/05/2024 8:15:19 AM  Radiology DG Chest Portable 1 View Result Date: 03/05/2024 CLINICAL DATA:  Chest pain. EXAM: PORTABLE CHEST 1 VIEW COMPARISON:  02/24/2024. FINDINGS: Stable cardiomediastinal silhouette. Aortic atherosclerosis. No focal consolidation, pleural  effusion, or pneumothorax. No acute osseous abnormality. IMPRESSION: No cardiopulmonary findings. Electronically Signed   By: Mannie Seek M.D.   On: 03/05/2024 08:20    Procedures Procedures    Medications Ordered in ED Medications  metoprolol  tartrate (LOPRESSOR ) injection 5 mg (5 mg Intravenous Given 03/05/24 0746)  carvedilol  (COREG ) tablet 25 mg (25 mg Oral Given 03/05/24 0746)    ED Course/ Medical Decision Making/ A&P Clinical Course as of 03/05/24 0828  Fri Mar 05, 2024  0721 Saw cardiology on 4/21 per their note:   "1. Paroxysmal A-fib Known history. Worsening per patient. Will get Holter monitor to evaluate A-fib burden. Will increase Coreg  to 25 mg p.o. twice daily. Patient currently anticoagulated with Eliquis. Treated with Coreg  and Tambocor   2. 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 Patient with chest tightness. Will get stress test and echocardiogram. Patient has prescription for nitroglycerin . Patient on Coreg , Repatha  3. Hypertension Known. Mildly elevated today in office. Patient states home blood pressures as high as 175 systolic. Coreg  increased today. Patient to monitor blood pressure for the next 2 weeks on new dose of Coreg . Continue Norvasc  5 mg, and lisinopril  4. Obstructive sleep apnea None. Patient compliant with CPAP. 5. Familial hypercholesterolemia No. Stable. Patient on Repatha. Patient to follow-up with Dr. Murray Arnold in lipid clinic. No changes"  [TY]  0724 EKG 12-Lead EKG on my independent interpretation appears to be atrial fibrillation with rapid ventricular response.  Rate of 109.  Normal  intervals.  No ST segment changes that would indicate ischemia.  QTc 473 [TY]  0733 CBC No leukocytosis to suggest systemic infection. [TY]  [id number] Basic metabolic panel(!) No significant metabolic derangements.  Minor elevation in creatinine, but not AKI. [TY]  0805 Appears to have converted to normal sinus rhythm with a rate of 69 bpm on the monitor as interpreted by me.  [TY]  0815 EKG 12-Lead Repeat EKG normal sinus rhythm at a rate of 65.  Normal intervals.  Again no ST segment changes that would indicate ischemia.  QTc 440 [TY]  0815 DG Chest Portable 1 View Chest x-ray no overt pneumonia pneumothorax on my independent review. [TY]  0825 Troponin T High Sensitivity: 17 ACS unlikely.  [TY]  0825 EKG 12-Lead MPRESSION: No cardiopulmonary findings.   [TY]    Clinical Course User Index [TY] Rolinda Climes, DO                                 Medical Decision Making This is a 68 year old female with atrial fibrillation on Eliquis, Coreg , CAD, hypertension presenting emergency department for tachycardia/palpitations.  Afebrile, tachycardic, hypertensive.  Appears to be atrial fibrillation ventricular ventricular response.  She is well-appearing on exam, largely asymptomatic.  Will get basic labs, however I assume her rate is secondary to not taking her beta-blocker last night and this morning.  Will give IV dose of metoprolol  and home dose of Coreg .  Update; converted to normal sinus rhythm.  Workup as noted in ED course.  Will discharge; patient followed up with cardiology.  She was supposed to have a cardiology appointment this morning at 815.  She will call to see if they can work her in today.  Amount and/or Complexity of Data Reviewed Labs: ordered. Decision-making details documented in ED Course. Radiology: ordered. Decision-making details documented in ED Course. ECG/medicine tests: ordered. Decision-making details documented in ED Course.  Risk Prescription drug  management. Decision regarding hospitalization. Diagnosis or treatment significantly limited by social determinants of health.          Final Clinical Impression(s) / ED Diagnoses Final diagnoses:  Paroxysmal atrial fibrillation [facility])    Rx / DC Orders ED Discharge Orders     None         Rolinda Climes, DO 03/05/24 [id number]

## 2024-03-05 NOTE — Discharge Instructions (Signed)
 Please take your atrial fibrillation medications.  Please follow-up with your cardiologist.  Return if develop fevers, chills, lightheadedness, passout, chest pain, shortness of breath, your heart rate begins racing, or any new or worsening symptoms that are concerning to you.

## 2024-03-05 NOTE — ED Triage Notes (Signed)
 Pt states that she did not take her beta blocker last night or this morning because she is scheduled to have a stress test this morning. Pt now saying she is hypertensive as high as 190's/110's and HR 149. Pt denies chest pain, does c/o SOB.   Anastacio Balm, RN

## 2024-04-17 ENCOUNTER — Emergency Department (HOSPITAL_BASED_OUTPATIENT_CLINIC_OR_DEPARTMENT_OTHER): Payer: Medicare (Managed Care)

## 2024-04-17 ENCOUNTER — Other Ambulatory Visit: Payer: Self-pay

## 2024-04-17 ENCOUNTER — Emergency Department (HOSPITAL_BASED_OUTPATIENT_CLINIC_OR_DEPARTMENT_OTHER)
Admission: EM | Admit: 2024-04-17 | Discharge: 2024-04-17 | Disposition: A | Discharge status: Home / Self Care | Payer: Medicare (Managed Care) | Attending: Emergency Medicine | Admitting: Emergency Medicine

## 2024-04-17 ENCOUNTER — Encounter (HOSPITAL_BASED_OUTPATIENT_CLINIC_OR_DEPARTMENT_OTHER): Payer: Self-pay | Admitting: Urology

## 2024-04-17 DIAGNOSIS — Z7982 Long term (current) use of aspirin: Secondary | ICD-10-CM | POA: Insufficient documentation

## 2024-04-17 DIAGNOSIS — Z8616 Personal history of COVID-19: Secondary | ICD-10-CM | POA: Diagnosis not present

## 2024-04-17 DIAGNOSIS — Z7901 Long term (current) use of anticoagulants: Secondary | ICD-10-CM | POA: Insufficient documentation

## 2024-04-17 DIAGNOSIS — Z79899 Other long term (current) drug therapy: Secondary | ICD-10-CM | POA: Insufficient documentation

## 2024-04-17 DIAGNOSIS — M25562 Pain in left knee: Secondary | ICD-10-CM | POA: Diagnosis present

## 2024-04-17 DIAGNOSIS — X501XXA Overexertion from prolonged static or awkward postures, initial encounter: Secondary | ICD-10-CM | POA: Diagnosis not present

## 2024-04-17 DIAGNOSIS — E039 Hypothyroidism, unspecified: Secondary | ICD-10-CM | POA: Diagnosis not present

## 2024-04-17 DIAGNOSIS — I2510 Atherosclerotic heart disease of native coronary artery without angina pectoris: Secondary | ICD-10-CM | POA: Diagnosis not present

## 2024-04-17 DIAGNOSIS — I10 Essential (primary) hypertension: Secondary | ICD-10-CM | POA: Insufficient documentation

## 2024-04-17 MED ORDER — LIDOCAINE 5 % EX PTCH: 1.0000 | MEDICATED_PATCH | CUTANEOUS | 0 refills | Status: AC

## 2024-04-17 NOTE — ED Triage Notes (Signed)
 Pt ambulatory to triage with cane  States 3 weeks ago had incident when breaking up dogs  Dog scraped tooth across left knee  Was on doxy to prevent infection, stopped taking it early  No wounds noted to knee  State pain in left knee continued and joint is weak  States felt pop in knee as well and it went out

## 2024-04-17 NOTE — ED Provider Notes (Signed)
 [place] [facility] Provider Note   CSN: [id number] Arrival date & time: 04/17/24  1045     Patient presents with: Knee Pain   Olivia Lawrence is a 68 y.o. female.   HPI     68yo female with history of paroxysmal atrial fibrillation on Eliquis, hypertension, coronary artery disease, hypercholesterolemia, OSA on CPAP, acid reflux, hypothyroidism who presents with concern for left knee pain.   Had scratch to area below knee, was placed on doxycycline by PCP.  3 weeks ago. When it happened twisted knee as well .  Area has not looked red. Stopped taking the doxycycline. Initially was feeling better.    1.5 weeks ago started having pain again, if twisted it a little bit knee gave out then excruciating pain.  Went back on the doxycycline after talking to doctor. This week has been in and out of pain. Has been 2-3 times has felt a pop in the knee. Has heart ablation coming up.   No fevers. No nausea, vomiting.     Past Medical History:  Diagnosis Date   Coronary artery disease    COVID-19    Epiglottitis    Hypertension      Prior to Admission medications   Medication Sig Start Date End Date Taking? Authorizing Provider  lidocaine  (LIDODERM ) 5 % Place 1 patch onto the skin daily. Remove & Discard patch within 12 hours or as directed by MD 04/17/24  Yes Dreama Longs, MD  amLODipine  (NORVASC ) 10 MG tablet Take 1 tablet (10 mg total) by mouth daily. 07/30/20 07/30/21  Jerri Keys, MD  amoxicillin -clavulanate (AUGMENTIN ) 875-125 MG tablet Take 1 tablet by mouth 2 (two) times daily. 01/07/23   Sofia, Leslie K, PA-C  ascorbic acid (VITAMIN C) 500 MG tablet Take 500 mg by mouth daily.    [provider]  aspirin  EC 81 MG tablet Take 81 mg by mouth daily with supper.     [provider]  Bempedoic Acid (NEXLETOL) 180 MG TABS Take 180 mg by mouth daily with breakfast.    [provider]  carvedilol  (COREG ) 12.5 MG tablet Take 12.5  mg by mouth 2 (two) times daily with a meal.    [provider]  cetirizine (ZYRTEC) 10 MG tablet Take 10 mg by mouth daily.    [provider]  Cholecalciferol (VITAMIN D-3) 125 MCG (5000 UT) TABS Take 5,000 Units by mouth daily.    [provider]  Cinnamon 500 MG TABS Take 500 mg by mouth daily.    [provider]  cyclobenzaprine  (FLEXERIL ) 10 MG tablet Take 1 tablet (10 mg total) by mouth 2 (two) times daily as needed for muscle spasms. 05/22/23   Curatolo, Adam, DO  escitalopram  (LEXAPRO ) 20 MG tablet Take 20 mg by mouth daily with supper.    [provider]  Evolocumab (REPATHA) 140 MG/ML SOSY Inject 140 mg into the skin every 14 (fourteen) days.    [provider]  famotidine  (PEPCID ) 10 MG tablet Take 1 tablet (10 mg total) by mouth daily for 4 days. 07/30/20 08/03/20  Jerri Keys, MD  flecainide  (TAMBOCOR ) 50 MG tablet Take 50 mg by mouth 2 (two) times daily with a meal.     [provider]  Ginkgo Biloba 120 MG CAPS Take 120 mg by mouth daily.    [provider]  ibuprofen (ADVIL) 200 MG tablet Take 600 mg by mouth every 6 (six) hours as needed for fever or  headache (pain).    [provider]  Lecithin 1000 MG CHEW Chew 1,000 mg by mouth daily.    [provider]  levothyroxine  (SYNTHROID , LEVOTHROID) 88 MCG tablet Take 88 mcg by mouth daily before breakfast.    [provider]  LORazepam  (ATIVAN ) 0.5 MG tablet Take 0.5 mg by mouth daily as needed for anxiety.    [provider]  meloxicam (MOBIC) 15 MG tablet Take 15 mg by mouth daily with breakfast.     [provider]  methylPREDNISolone  (MEDROL  DOSEPAK) 4 MG TBPK tablet 6,5,4,3,2,1 01/07/23   Sofia, Leslie K, PA-C  Multiple Minerals (MULTI-MINERALS) TABS Take 1 tablet by mouth daily.    [provider]  nitroGLYCERIN  (NITROSTAT ) 0.4 MG SL tablet Place 0.4 mg under the tongue every 5 (five) minutes as needed for chest  pain. 06/13/17   [provider]  Omega-3 Fatty Acids (FISH OIL) 1000 MG CAPS Take 1,000 mg by mouth daily.    [provider]  omeprazole  (PRILOSEC  OTC) 20 MG tablet Take 20 mg by mouth daily as needed (acid reflux/indigestion).    [provider]  OVER THE COUNTER MEDICATION Take 1,000 mg by mouth daily. EDTA    [provider]  predniSONE  (DELTASONE ) 10 MG tablet Label  & dispense according to the schedule below. 4 Pills PO on day one, 3Pills PO on day two, 2 Pills PO on day three, 1 Pills PO on day four,  then STOP.  Total of 10 tabs 07/30/20   Jerri Keys, MD    Allergies: Dilaudid [hydromorphone hcl] and Zetia [ezetimibe]    Review of Systems  Updated Vital Signs BP (!) 126/53   Pulse 63   Temp (!) 97 F (36.1 C)   Resp 18   Ht 5' 1 (1.549 m)   Wt 74.4 kg   LMP  (LMP Unknown)   SpO2 98%   BMI 30.99 kg/m   Physical Exam Vitals and nursing note reviewed.  Constitutional:      General: She is not in acute distress.    Appearance: Normal appearance. She is not ill-appearing, toxic-appearing or diaphoretic.  HENT:     Head: Normocephalic.   Eyes:     Conjunctiva/sclera: Conjunctivae normal.    Cardiovascular:     Rate and Rhythm: Normal rate and regular rhythm.     Pulses: Normal pulses.  Pulmonary:     Effort: Pulmonary effort is normal. No respiratory distress.   Musculoskeletal:        General: Tenderness (mild around knee) present. No deformity or signs of injury.     Cervical back: No rigidity.     Right lower leg: No edema.     Left lower leg: No edema.     Comments: Able to raise in extension, flex   Skin:    General: Skin is warm and dry.     Coloration: Skin is not jaundiced or pale.     Comments: Healed scratch to medial leg inferior to knee, no erythema   Neurological:     General: No focal deficit present.     Mental Status: She is alert and oriented to person, place, and time.     (all labs ordered are  listed, but only abnormal results are displayed) Labs Reviewed - No data to display  EKG: None  Radiology: DG Knee Complete 4 Views Left Result Date: 04/17/2024 CLINICAL DATA:  Dog bite to the knee with left knee pain EXAM: LEFT KNEE -  COMPLETE 4 VIEW COMPARISON:  Left knee radiograph dated 04/05/2021 FINDINGS: No evidence of fracture, dislocation, or joint effusion. Mild tricompartmental degenerative changes of the knee. Mild soft tissue reticulations about the knee. IMPRESSION: 1. No acute fracture or dislocation. 2. Mild soft tissue edema of the knee. Electronically Signed   By: Limin  Xu M.D.   On: 04/17/2024 11:22     Procedures   Medications Ordered in the ED - No data to display                                   68yo female with history of paroxysmal atrial fibrillation on Eliquis, hypertension, coronary artery disease, hypercholesterolemia, OSA on CPAP, acid reflux, hypothyroidism who presents with concern for left knee pain.   Pain developed after an incident with a dog when she twisted her knee and was scratched.  She was placed on doxycycline due to the scratch.  This area appears healed at this time, no sign of erythema or infection.  She has no significant pain with range of motion, no effusion, no erythema, no fever, no warmth, and have a very low clinical suspicion for septic arthritis.  X-ray completed and shows no evidence of fracture or dislocation, no joint effusion, shows mild degenerative changes and mild soft tissue edema.  Suspect her knee pain is likely secondary to the twisting mechanism and internal derangement of knee or meniscal injury.  Have low clinical suspicion for occult fracture given mechanism, her initial ability to bear weight without problems.  Her knee giving way was in the setting of pain, she is not having any other numbness, weakness or strokelike symptoms.  She has normal pulses bilaterally, low suspicion for acute arterial thrombus.  No asymmetric  leg swelling and have low suspicion for DVT particularly in the setting of her being on anticoagulation.  Do feel the mechanism and description of symptoms are most likely consistent with a meniscal injury, although other knee sprain possible.  Recommend knee sleeve, WBAT, crutches or walker for support and follow up with Orthopedics.  Given number to knee specialist at [facility] given [place] on call.  She would like to avoid narcotics for pain-given lidoderm  patch rx.  Patient discharged in stable condition with understanding of reasons to return.      Final diagnoses:  Acute pain of left knee    ED Discharge Orders          Ordered    lidocaine  (LIDODERM ) 5 %  Every 24 hours        04/17/24 1209               Dreama Longs, MD 04/17/24 1227

## 2024-04-17 NOTE — ED Notes (Addendum)
 Pts knee wrapped with ace wrap. Attempted crutches but pt lost balance and almost fell forward. Pt has walker at home she would prefer to use. Able to use cane to ambulate out of ED. Reviewed discharge instructions, medications and follow up with pt pt states understanding EDP aware

## 2024-06-10 ENCOUNTER — Emergency Department (HOSPITAL_BASED_OUTPATIENT_CLINIC_OR_DEPARTMENT_OTHER): Payer: Medicare (Managed Care)

## 2024-06-10 ENCOUNTER — Emergency Department (HOSPITAL_BASED_OUTPATIENT_CLINIC_OR_DEPARTMENT_OTHER)
Admission: EM | Admit: 2024-06-10 | Discharge: 2024-06-11 | Disposition: A | Discharge status: Other Acute Inpatient Hospital | Payer: Medicare (Managed Care) | Attending: Emergency Medicine | Admitting: Emergency Medicine

## 2024-06-10 ENCOUNTER — Other Ambulatory Visit: Payer: Self-pay

## 2024-06-10 ENCOUNTER — Encounter (HOSPITAL_BASED_OUTPATIENT_CLINIC_OR_DEPARTMENT_OTHER): Payer: Self-pay | Admitting: Emergency Medicine

## 2024-06-10 DIAGNOSIS — Z87891 Personal history of nicotine dependence: Secondary | ICD-10-CM | POA: Insufficient documentation

## 2024-06-10 DIAGNOSIS — E039 Hypothyroidism, unspecified: Secondary | ICD-10-CM | POA: Insufficient documentation

## 2024-06-10 DIAGNOSIS — Z7901 Long term (current) use of anticoagulants: Secondary | ICD-10-CM | POA: Diagnosis not present

## 2024-06-10 DIAGNOSIS — Z8616 Personal history of COVID-19: Secondary | ICD-10-CM | POA: Insufficient documentation

## 2024-06-10 DIAGNOSIS — I10 Essential (primary) hypertension: Secondary | ICD-10-CM | POA: Diagnosis not present

## 2024-06-10 DIAGNOSIS — I4891 Unspecified atrial fibrillation: Secondary | ICD-10-CM | POA: Insufficient documentation

## 2024-06-10 DIAGNOSIS — Z7982 Long term (current) use of aspirin: Secondary | ICD-10-CM | POA: Insufficient documentation

## 2024-06-10 DIAGNOSIS — I2510 Atherosclerotic heart disease of native coronary artery without angina pectoris: Secondary | ICD-10-CM | POA: Diagnosis not present

## 2024-06-10 DIAGNOSIS — Z79899 Other long term (current) drug therapy: Secondary | ICD-10-CM | POA: Diagnosis not present

## 2024-06-10 DIAGNOSIS — R002 Palpitations: Secondary | ICD-10-CM | POA: Diagnosis present

## 2024-06-10 LAB — CBC
HCT: 34.9 % — ABNORMAL LOW (ref 36.0–46.0)
Hemoglobin: 11.9 g/dL — ABNORMAL LOW (ref 12.0–15.0)
MCH: 31.2 pg (ref 26.0–34.0)
MCHC: 34.1 g/dL (ref 30.0–36.0)
MCV: 91.4 fL (ref 80.0–100.0)
Platelets: 342 K/uL (ref 150–400)
RBC: 3.82 MIL/uL — ABNORMAL LOW (ref 3.87–5.11)
RDW: 13.6 % (ref 11.5–15.5)
WBC: 7.6 K/uL (ref 4.0–10.5)
nRBC: 0 % (ref 0.0–0.2)

## 2024-06-10 LAB — BASIC METABOLIC PANEL WITH GFR
Anion gap: 14 (ref 5–15)
BUN: 20 mg/dL (ref 8–23)
CO2: 21 mmol/L — ABNORMAL LOW (ref 22–32)
Calcium: 9.2 mg/dL (ref 8.9–10.3)
Chloride: 107 mmol/L (ref 98–111)
Creatinine, Ser: 0.91 mg/dL (ref 0.44–1.00)
GFR, Estimated: >60 mL/min (ref >60)
Glucose, Bld: 99 mg/dL (ref 70–99)
Potassium: 3.7 mmol/L (ref 3.5–5.1)
Sodium: 141 mmol/L (ref 135–145)

## 2024-06-10 LAB — MAGNESIUM: Magnesium: 1.9 mg/dL (ref 1.7–2.4)

## 2024-06-10 LAB — TROPONIN T, HIGH SENSITIVITY: Troponin T High Sensitivity: 18 ng/L (ref 0–19)

## 2024-06-10 MED ORDER — DILTIAZEM HCL-DEXTROSE 125-5 MG/125ML-% IV SOLN (PREMIX)
5.0000 mg/h | INTRAVENOUS | Status: DC
  Administered 2024-06-10: 5 mg/h via INTRAVENOUS
  Filled 2024-06-10: qty 125

## 2024-06-10 MED ORDER — DILTIAZEM LOAD VIA INFUSION
10.0000 mg | Freq: Once | INTRAVENOUS | Status: AC
  Administered 2024-06-10: 10 mg via INTRAVENOUS
  Filled 2024-06-10: qty 10

## 2024-06-10 NOTE — ED Triage Notes (Signed)
 Pt reports HTN at home of 168/119, states hx of recent ablation, hx of afib  C/o ShoB, weakness, palpitations

## 2024-06-10 NOTE — ED Provider Notes (Signed)
 [place] [facility] Provider Note  CSN: [id number] Arrival date & time: 06/10/24 2126  Chief Complaint(s) Palpitations and Hypertension  HPI Olivia Lawrence is a 68 y.o. female history of A-fib on Eliquis, hypertension, hypothyroidism presenting to the emergency department with palpitations.  Patient reports recent ablation the end of last month.  She held her Eliquis for this procedure.  Today she started feeling bad, checked her blood pressure was high, showed high heart rate so she came to the ER.  Reports she does not always know when she is in atrial fibrillation.  Denies any chest pain.  Reports some generalized weakness and shortness of breath.  No fevers or chills.  No abdominal pain.  No other new symptoms.   Past Medical History Past Medical History:  Diagnosis Date   Coronary artery disease    COVID-19    Epiglottitis    Hypertension    Patient Active Problem List   Diagnosis Date Noted   Hypertension    Hypothyroidism    GERD (gastroesophageal reflux disease)    Depression    Epiglottiditis 01/12/2019   Epiglottitis 01/11/2019   Home Medication(s) Prior to Admission medications   Medication Sig Start Date End Date Taking? Authorizing Provider  amLODipine (NORVASC) 10 MG tablet Take 1 tablet (10 mg total) by mouth daily. 07/30/20 07/30/21  Jerri Keys, MD  amoxicillin-clavulanate (AUGMENTIN) 875-125 MG tablet Take 1 tablet by mouth 2 (two) times daily. 01/07/23   Sofia, Leslie K, PA-C  ascorbic acid (VITAMIN C) 500 MG tablet Take 500 mg by mouth daily.    [provider]  aspirin EC 81 MG tablet Take 81 mg by mouth daily with supper.     [provider]  Bempedoic Acid (NEXLETOL) 180 MG TABS Take 180 mg by mouth daily with breakfast.    [provider]  carvedilol (COREG) 12.5 MG tablet Take 12.5 mg by mouth 2 (two) times daily with a meal.    [provider]  cetirizine (ZYRTEC) 10 MG tablet Take 10  mg by mouth daily.    [provider]  Cholecalciferol (VITAMIN D-3) 125 MCG (5000 UT) TABS Take 5,000 Units by mouth daily.    [provider]  Cinnamon 500 MG TABS Take 500 mg by mouth daily.    [provider]  cyclobenzaprine (FLEXERIL) 10 MG tablet Take 1 tablet (10 mg total) by mouth 2 (two) times daily as needed for muscle spasms. 05/22/23   Curatolo, Adam, DO  escitalopram (LEXAPRO) 20 MG tablet Take 20 mg by mouth daily with supper.    [provider]  Evolocumab (REPATHA) 140 MG/ML SOSY Inject 140 mg into the skin every 14 (fourteen) days.    [provider]  famotidine (PEPCID) 10 MG tablet Take 1 tablet (10 mg total) by mouth daily for 4 days. 07/30/20 08/03/20  Jerri Keys, MD  flecainide (TAMBOCOR) 50 MG tablet Take 50 mg by mouth 2 (two) times daily with a meal.     [provider]  Ginkgo Biloba 120 MG CAPS Take 120 mg by mouth daily.    [provider]  ibuprofen (ADVIL) 200 MG tablet Take 600 mg by mouth every 6 (six) hours as needed for fever or headache (pain).    [provider]  Lecithin 1000 MG CHEW Chew 1,000 mg by mouth daily.    [provider]  levothyroxine (SYNTHROID, LEVOTHROID) 88 MCG tablet Take 88 mcg by mouth daily before breakfast.  [provider]  lidocaine (LIDODERM) 5 % Place 1 patch onto the skin daily. Remove & Discard patch within 12 hours or as directed by MD 04/17/24   Dreama Longs, MD  LORazepam (ATIVAN) 0.5 MG tablet Take 0.5 mg by mouth daily as needed for anxiety.    [provider]  meloxicam (MOBIC) 15 MG tablet Take 15 mg by mouth daily with breakfast.     [provider]  methylPREDNISolone (MEDROL DOSEPAK) 4 MG TBPK tablet 6,5,4,3,2,1 01/07/23   Sofia, Leslie K, PA-C  Multiple Minerals (MULTI-MINERALS) TABS Take 1 tablet by mouth daily.    [provider]  nitroGLYCERIN (NITROSTAT) 0.4 MG SL tablet Place 0.4 mg under the tongue every  5 (five) minutes as needed for chest pain. 06/13/17   [provider]  Omega-3 Fatty Acids (FISH OIL) 1000 MG CAPS Take 1,000 mg by mouth daily.    [provider]  omeprazole (PRILOSEC OTC) 20 MG tablet Take 20 mg by mouth daily as needed (acid reflux/indigestion).    [provider]  OVER THE COUNTER MEDICATION Take 1,000 mg by mouth daily. EDTA    [provider]  predniSONE (DELTASONE) 10 MG tablet Label  & dispense according to the schedule below. 4 Pills PO on day one, 3Pills PO on day two, 2 Pills PO on day three, 1 Pills PO on day four,  then STOP.  Total of 10 tabs 07/30/20   Jerri Keys, MD                                                                                                                                    Past Surgical History Past Surgical History:  Procedure Laterality Date   APPENDECTOMY     CARDIAC SURGERY     Family History History reviewed. No pertinent family history.  Social History Social History   Tobacco Use   Smoking status: Former    Types: Cigarettes   Smokeless tobacco: Never  Vaping Use   Vaping status: Never Used  Substance Use Topics   Alcohol use: Yes    Alcohol/week: 2.0 standard drinks of alcohol    Types: 2 Glasses of wine per week    Comment: daily   Drug use: Never   Allergies Dilaudid [hydromorphone hcl] and Zetia [ezetimibe]  Review of Systems Review of Systems  All other systems reviewed and are negative.   Physical Exam Vital Signs  I have reviewed the triage vital signs BP 122/67   Pulse (!) 116   Temp 97.9 F (36.6 C) (Oral)   Resp (!) 22   Ht 5' 1 (1.549 m)   Wt 77.1 kg   LMP  (LMP Unknown)   SpO2 95%   BMI 32.12 kg/m  Physical Exam Vitals and nursing note reviewed.  Constitutional:      General: She is not in acute distress.    Appearance: She is well-developed.  HENT:     Head: Normocephalic and atraumatic.     Mouth/Throat:     Mouth: Mucous membranes are moist.   Eyes:     Pupils: Pupils are equal, round, and reactive to light.  Cardiovascular:     Rate and Rhythm: Tachycardia present. Rhythm irregular.     Heart sounds: No murmur heard. Pulmonary:     Effort: Pulmonary effort is normal. No respiratory distress.     Breath sounds: Normal breath sounds.  Abdominal:     General: Abdomen is flat.     Palpations: Abdomen is soft.     Tenderness: There is no abdominal tenderness.  Musculoskeletal:        General: No tenderness.     Right lower leg: No edema.     Left lower leg: No edema.  Skin:    General: Skin is warm and dry.  Neurological:     General: No focal deficit present.     Mental Status: She is alert. Mental status is at baseline.  Psychiatric:        Mood and Affect: Mood normal.        Behavior: Behavior normal.     ED Results and Treatments Labs (all labs ordered are listed, but only abnormal results are displayed) Labs Reviewed  BASIC METABOLIC PANEL WITH GFR - Abnormal; Notable for the following components:      Result Value   CO2 21 (*)    All other components within normal limits  CBC - Abnormal; Notable for the following components:   RBC 3.82 (*)    Hemoglobin 11.9 (*)    HCT 34.9 (*)    All other components within normal limits  MAGNESIUM  TROPONIN T, HIGH SENSITIVITY  TROPONIN T, HIGH SENSITIVITY                                                                                                                          Radiology DG Chest Port 1 View Result Date: 06/10/2024 CLINICAL DATA:  Chest pain EXAM: PORTABLE CHEST 1 VIEW COMPARISON:  Chest x-ray 03/06/2024 FINDINGS: The heart size and mediastinal contours are within normal limits. Both lungs are clear. There are atherosclerotic calcifications of the aorta. The visualized skeletal structures are unremarkable. IMPRESSION: No active disease. Electronically Signed   By: Greig Pique M.D.   On: 06/10/2024 22:38    Pertinent labs & imaging results that were  available during my care of the patient were reviewed by me and considered in my medical decision making (see MDM for details).  Medications Ordered in ED Medications  diltiazem (CARDIZEM) 1 mg/mL load via infusion 10 mg (10 mg Intravenous Bolus from Bag 06/10/24 2233)    And  diltiazem (CARDIZEM) 125 mg in dextrose 5% 125 mL (1 mg/mL) infusion (7.5 mg/hr Intravenous Rate/Dose Change 06/10/24 2318)  Procedures .Critical Care  Performed by: Francesca Elsie CROME, MD Authorized by: Francesca Elsie CROME, MD   Critical care provider statement:    Critical care time (minutes):  30   Critical care was necessary to treat or prevent imminent or life-threatening deterioration of the following conditions:  Cardiac failure   Critical care was time spent personally by me on the following activities:  Development of treatment plan with patient or surrogate, discussions with consultants, evaluation of patient's response to treatment, examination of patient, ordering and review of laboratory studies, ordering and review of radiographic studies, ordering and performing treatments and interventions, pulse oximetry, re-evaluation of patient's condition and review of old charts   Care discussed with: accepting provider at another facility     (including critical care time)  Medical Decision Making / ED Course   MDM:  68 year old presenting to the emergency department atrial fibrillation.  Patient overall well-appearing, physical examination with irregular fast heart rate.  Seems consistent with atrial fibrillation and EKG confirms this.  Suspect likely primary atrial fibrillation.  Patient denies other symptoms to suggest any specific underlying cause.  Workup overall is reassuring including normal troponin.  Chest x-ray is clear.  Considered ER cardioversion however  unfortunately patient does not know specifically when her symptoms began and she missed a couple doses of Eliquis in the past 30 days due to her procedure.  She is started on a diltiazem drip and will need to be admitted given her rapid heart rate.  She had her procedure performed at [facility] will consult them for transfer.  Clinical Course as of 06/11/24 0000  Thu Jun 10, 2024  2358 Discussed with Dr. Arne at [facility], they excepted patient for transfer pending bed availability.  They will call back about this.  If there are not any beds avail patient is okay being admitted to [facility] also. Signed out to Dr. Nettie pending transfer vs admit  [WS]    Clinical Course User Index [WS] Francesca Elsie CROME, MD     Additional history obtained:  -External records from outside source obtained and reviewed including: Chart review including previous notes, labs, imaging, consultation notes including outside records    Lab Tests: -I ordered, reviewed, and interpreted labs.   The pertinent results include:   Labs Reviewed  BASIC METABOLIC PANEL WITH GFR - Abnormal; Notable for the following components:      Result Value   CO2 21 (*)    All other components within normal limits  CBC - Abnormal; Notable for the following components:   RBC 3.82 (*)    Hemoglobin 11.9 (*)    HCT 34.9 (*)    All other components within normal limits  MAGNESIUM  TROPONIN T, HIGH SENSITIVITY  TROPONIN T, HIGH SENSITIVITY    Notable for mild anemia   EKG   EKG Interpretation Date/Time:  Thursday June 10 2024 21:47:02 EDT Ventricular Rate:  155 PR Interval:    QRS Duration:  84 QT Interval:  261 QTC Calculation: 419 R Axis:   84  Text Interpretation: Atrial fibrillation with rapid V-rate Borderline right axis deviation Repolarization abnormality, prob rate related Confirmed by Francesca Elsie ([id number]) on 06/10/2024 11:31:31 PM         Imaging Studies  ordered: I ordered imaging studies including CXR On my interpretation imaging demonstrates no acute process I independently visualized and interpreted imaging. I agree with the radiologist interpretation   Medicines ordered and prescription drug  management: Meds ordered this encounter  Medications   AND Linked Order Group    diltiazem (CARDIZEM) 1 mg/mL load via infusion 10 mg    diltiazem (CARDIZEM) 125 mg in dextrose 5% 125 mL (1 mg/mL) infusion    -I have reviewed the patients home medicines and have made adjustments as needed   Cardiac Monitoring: The patient was maintained on a cardiac monitor.  I personally viewed and interpreted the cardiac monitored which showed an underlying rhythm of: afib  Social Determinants of Health:  Diagnosis or treatment significantly limited by social determinants of health: obesity   Co morbidities that complicate the patient evaluation  Past Medical History:  Diagnosis Date   Coronary artery disease    COVID-19    Epiglottitis    Hypertension       Dispostion: Disposition decision including need for hospitalization was considered, and patient disposition pending at time of sign out.    Final Clinical Impression(s) / ED Diagnoses Final diagnoses:  Atrial fibrillation with RVR (HCC)     This chart was dictated using voice recognition software.  Despite best efforts to proofread,  errors can occur which can change the documentation meaning.    Francesca Elsie CROME, MD 06/11/24 0000

## 2024-06-11 DIAGNOSIS — E039 Hypothyroidism, unspecified: Secondary | ICD-10-CM | POA: Diagnosis not present

## 2024-06-11 DIAGNOSIS — R002 Palpitations: Secondary | ICD-10-CM | POA: Diagnosis present

## 2024-06-11 DIAGNOSIS — Z7982 Long term (current) use of aspirin: Secondary | ICD-10-CM | POA: Diagnosis not present

## 2024-06-11 DIAGNOSIS — Z8616 Personal history of COVID-19: Secondary | ICD-10-CM | POA: Diagnosis not present

## 2024-06-11 DIAGNOSIS — I4891 Unspecified atrial fibrillation: Secondary | ICD-10-CM | POA: Diagnosis not present

## 2024-06-11 DIAGNOSIS — I2510 Atherosclerotic heart disease of native coronary artery without angina pectoris: Secondary | ICD-10-CM | POA: Diagnosis not present

## 2024-06-11 DIAGNOSIS — I10 Essential (primary) hypertension: Secondary | ICD-10-CM | POA: Diagnosis not present

## 2024-06-11 DIAGNOSIS — Z79899 Other long term (current) drug therapy: Secondary | ICD-10-CM | POA: Diagnosis not present

## 2024-06-11 DIAGNOSIS — Z87891 Personal history of nicotine dependence: Secondary | ICD-10-CM | POA: Diagnosis not present

## 2024-06-11 DIAGNOSIS — Z7901 Long term (current) use of anticoagulants: Secondary | ICD-10-CM | POA: Diagnosis not present

## 2024-06-11 LAB — TROPONIN T, HIGH SENSITIVITY: Troponin T High Sensitivity: 18 ng/L (ref 0–19)

## 2024-06-11 NOTE — ED Notes (Signed)
 Made EDP aware of Pt. BP and HR to see if I should discontinue Cardezem drip.  EDP has no concerns and is ok with continuing Drip.

## 2024-10-12 NOTE — Progress Notes (Signed)
 Physical Therapy Visit - Daily Note   Payor: [employer] / Plan: [place] / Product Type: PPO /   Visit Count: 7   Rehabilitation Precautions/Restrictions:   Precautions/Restrictions Precautions: TKA on 09/14/2024, T2DM, GERD, anxiety, previous MI, sleep apnea, hypothyroidism, A-fib        Referring Diagnosis: M17.10 - Primary osteoarthritis of one knee   SUBJECTIVE Patient Report:   Pt reports the knee is about the same soreness level, still been consistent w/ bending the knee.  Change in Status Since Last Visit: No Pain:  Pain Assessment Pain Assessment: 0-10 Pain Score  : 5 Pain Type: Acute pain, Surgical pain Pain Location: Knee Pain Orientation: Left Response to Interventions: therapy to [place]  OBJECTIVE  General Observation/Objective Measures:         Knee AROM: 0-2-74        Interventions:  Therapeutic Exercise combo bike x 7'  Heel slides x30 Standing lunge knee flexion stretch 3x1'  Seated knee flexon AAROM x 30  LLLD for knee flexion x 5' in quantum   Manual Therapy: PF joint mobs G3-4 in all directions  TF joint mobs G3-4 anterior,posterior and long axis distraction   Education: Yes, as described in interventions   ASSESSMENT Pt noted minimal improvement today in her knee flexion today and is still limited by anterior distal quad soreness/pain. She still notes to have poor TF and PF joint mobility as well but responded well to MT techniques. Will continue to progress as tolerated at the next session     Therapy Diagnosis:     ICD-10-CM   1. Primary osteoarthritis of one knee  M17.10   2. Chronic pain of left knee  M25.562, G89.29   3. Total knee replacement status, left  Z96.652     Progress Towards Goals:     Goals Addressed             This Visit's Progress    PT Goal       Short Term Goals (STG) - Time Frame 8 visits STG 1: Patient will be independent and compliant with all HEP 09/21/2024: Patient reports compliance with  current HEP  STG 2: Patient will report 1/10 or less knee pain at rest in order to allow progression toward LTG. 09/21/2024: Patient seen of severe left knee pain. STG 3: Patient will be able to demonstrate at least 90 degrees of flexion ROM 09/21/2024: Patient achieved 57 degrees of left knee flexion today.  Long Term Goals (LTG) - Time Frame 16 visits LTG 1: Patient will improve LEFS disability score to <40 in order to demonstrate statistically significant improvement in patient perceived function. LTG 2: Patient will be able to demonstrate 5xSTS to under 13 seconds to increase his functional capacity LTG 3: Patient will be able to demonstrate symmetrical knee ROM to increase her ability to ambulate downstairs LTG 4: Patient will be able to walk for at least 30 minutes w/o pain or difficulty          PLAN Treatment Frequency and Duration:  PT Frequency: 2x/week; 1x/week Treatment Plan Details: 1-2x/week for 16 visits  Recommended PT Treatment/Interventions: Dry needling (1-2 muscles) (79439); Electrical stimulation-unattended (02985); Manual therapy (97140); Neuromuscular re-education [phone]); Therapeutic activity (97530); Therapeutic exercise [phone])   Recommended Consults:  None currently  Development of Plan of Care:  No change in POC.  Total Treatment Time (Time & Untimed): Total Treatment Time: 30 Total Time in Timed Codes: Time in Timed Codes: 30     Treatment/Procedures  Manual Therapy Tech, 1 + regions minutes: 10 Therapeutic Exercises minutes: 20             The patient has been instructed to contact our clinic if any questions or problems should arise.

## 2024-10-15 ENCOUNTER — Other Ambulatory Visit: Payer: Self-pay

## 2024-10-15 ENCOUNTER — Emergency Department (HOSPITAL_BASED_OUTPATIENT_CLINIC_OR_DEPARTMENT_OTHER)
Admission: EM | Admit: 2024-10-15 | Discharge: 2024-10-15 | Disposition: A | Discharge status: Home / Self Care | Payer: Medicare (Managed Care) | Source: Ambulatory Visit | Attending: Emergency Medicine | Admitting: Emergency Medicine

## 2024-10-15 DIAGNOSIS — E039 Hypothyroidism, unspecified: Secondary | ICD-10-CM | POA: Diagnosis not present

## 2024-10-15 DIAGNOSIS — N179 Acute kidney failure, unspecified: Secondary | ICD-10-CM | POA: Insufficient documentation

## 2024-10-15 DIAGNOSIS — I959 Hypotension, unspecified: Secondary | ICD-10-CM | POA: Insufficient documentation

## 2024-10-15 DIAGNOSIS — Z7982 Long term (current) use of aspirin: Secondary | ICD-10-CM | POA: Diagnosis not present

## 2024-10-15 DIAGNOSIS — E860 Dehydration: Secondary | ICD-10-CM | POA: Insufficient documentation

## 2024-10-15 DIAGNOSIS — I10 Essential (primary) hypertension: Secondary | ICD-10-CM | POA: Insufficient documentation

## 2024-10-15 LAB — TROPONIN T, HIGH SENSITIVITY
Troponin T High Sensitivity: 24 ng/L — ABNORMAL HIGH (ref 0–19)
Troponin T High Sensitivity: 19 ng/L (ref 0–19)

## 2024-10-15 LAB — CBC WITH DIFFERENTIAL/PLATELET
Abs Immature Granulocytes: 0.01 K/uL (ref 0.00–0.07)
Basophils Absolute: 0.1 K/uL (ref 0.0–0.1)
Basophils Relative: 1 %
Eosinophils Absolute: 0.3 K/uL (ref 0.0–0.5)
Eosinophils Relative: 5 %
HCT: 32.3 % — ABNORMAL LOW (ref 36.0–46.0)
Hemoglobin: 10.5 g/dL — ABNORMAL LOW (ref 12.0–15.0)
Immature Granulocytes: 0 %
Lymphocytes Relative: 24 %
Lymphs Abs: 1.6 K/uL (ref 0.7–4.0)
MCH: 30.3 pg (ref 26.0–34.0)
MCHC: 32.5 g/dL (ref 30.0–36.0)
MCV: 93.1 fL (ref 80.0–100.0)
Monocytes Absolute: 0.5 K/uL (ref 0.1–1.0)
Monocytes Relative: 8 %
Neutro Abs: 3.9 K/uL (ref 1.7–7.7)
Neutrophils Relative %: 62 %
Platelets: 412 K/uL — ABNORMAL HIGH (ref 150–400)
RBC: 3.47 MIL/uL — ABNORMAL LOW (ref 3.87–5.11)
RDW: 13.7 % (ref 11.5–15.5)
WBC: 6.4 K/uL (ref 4.0–10.5)
nRBC: 0 % (ref 0.0–0.2)

## 2024-10-15 LAB — URINALYSIS, ROUTINE W REFLEX MICROSCOPIC
Bilirubin Urine: NEGATIVE
Glucose, UA: NEGATIVE mg/dL
Hgb urine dipstick: NEGATIVE
Ketones, ur: NEGATIVE mg/dL
Nitrite: NEGATIVE
Protein, ur: NEGATIVE mg/dL
Specific Gravity, Urine: 1.015 (ref 1.005–1.030)
pH: 6 (ref 5.0–8.0)

## 2024-10-15 LAB — BASIC METABOLIC PANEL WITH GFR
Anion gap: 12 (ref 5–15)
BUN: 24 mg/dL — ABNORMAL HIGH (ref 8–23)
CO2: 21 mmol/L — ABNORMAL LOW (ref 22–32)
Calcium: 8.4 mg/dL — ABNORMAL LOW (ref 8.9–10.3)
Chloride: 106 mmol/L (ref 98–111)
Creatinine, Ser: 1.33 mg/dL — ABNORMAL HIGH (ref 0.44–1.00)
GFR, Estimated: 43 mL/min — ABNORMAL LOW
Glucose, Bld: 82 mg/dL (ref 70–99)
Potassium: 3.9 mmol/L (ref 3.5–5.1)
Sodium: 139 mmol/L (ref 135–145)

## 2024-10-15 LAB — COMPREHENSIVE METABOLIC PANEL WITH GFR
ALT: 13 U/L (ref 0–44)
AST: 20 U/L (ref 15–41)
Albumin: 4.4 g/dL (ref 3.5–5.0)
Alkaline Phosphatase: 66 U/L (ref 38–126)
Anion gap: 13 (ref 5–15)
BUN: 27 mg/dL — ABNORMAL HIGH (ref 8–23)
CO2: 25 mmol/L (ref 22–32)
Calcium: 9.5 mg/dL (ref 8.9–10.3)
Chloride: 102 mmol/L (ref 98–111)
Creatinine, Ser: 1.93 mg/dL — ABNORMAL HIGH (ref 0.44–1.00)
GFR, Estimated: 27 mL/min — ABNORMAL LOW
Glucose, Bld: 107 mg/dL — ABNORMAL HIGH (ref 70–99)
Potassium: 4.2 mmol/L (ref 3.5–5.1)
Sodium: 139 mmol/L (ref 135–145)
Total Bilirubin: 0.4 mg/dL (ref 0.0–1.2)
Total Protein: 6.9 g/dL (ref 6.5–8.1)

## 2024-10-15 LAB — URINALYSIS, MICROSCOPIC (REFLEX): RBC / HPF: NONE SEEN RBC/hpf (ref 0–5)

## 2024-10-15 MED ORDER — LACTATED RINGERS IV BOLUS
500.0000 mL | Freq: Once | INTRAVENOUS | Status: AC
  Administered 2024-10-15: 500 mL via INTRAVENOUS

## 2024-10-15 MED ORDER — LACTATED RINGERS IV BOLUS
1000.0000 mL | Freq: Once | INTRAVENOUS | Status: AC
  Administered 2024-10-15: 1000 mL via INTRAVENOUS

## 2024-10-15 NOTE — ED Triage Notes (Addendum)
 Pt in after near syncope and hypotension while over at her PT appointment today, seen at [facility] and they sent her here. Pt states she did take Oxy and Tizanidine prior this morning, had a recent knee surgery. Dizziness and HA reported upon standing, denies any sob or cp.  74/46 in triage

## 2024-10-15 NOTE — Progress Notes (Signed)
 Interval History: Nira Visscher is a 68 y.o. female presenting today 4 weeks s/p below procedure. Reports she is concerned about PT progress.  She feels she may not be progressing due to the mental aspects of her recovery.  Otherwise she is doing well.    INSERT SURGERY DETAILS  Date of Surgery: 09/14/2024   Pre-op Diagnosis:     * Primary osteoarthritis of left knee [M17.10]   Postoperative Diagnosis:       * Primary osteoarthritis of left knee [M17.10]   Procedure(s): ARTHROPLASTY KNEE TOTAL REPLACEMENT, LEFT   Exam:  Incision clean dry and intact, well-healing.  No signs of infection Very mild swelling about the knee Full extension Knee flexion ~80 deg  NVI  Imaging: Xrays of the left knee taken today, 10/15/2024, independently reviewed and interpreted by me show evidence of total knee arthroplasty no complications  Assessment: 4 weeks s/p above procedure  Plan:  Patient is making good, gradual progress postoperatively. Her pain is well controlled and her range of motion is improving, although slowly.  Advised that although we would like to see more improvement with her right knee flexion overall she is continuing to do very well for 4 weeks out.  Discussed that if her preop range of motion was relatively limited this may be why postop range of motion is progressing slower.  She was advised to continue with physical therapy and home exercises. I advised her to avoid activities that cause sharp pain. Follow up in 2 weeks.

## 2024-10-15 NOTE — Discharge Instructions (Addendum)
 Make sure you are drinking plenty of water.  Stop taking the fluid pill (Chlorthalidone). See your Physician for recheck next week.  Be carefl when taking sedating medications

## 2024-10-15 NOTE — ED Provider Notes (Signed)
 [facility] [place] [facility] Provider Note   CSN: [id number] Arrival date & time: 10/15/24  1332     Patient presents with: Hypotension and Near Syncope   Olivia Lawrence is a 68 y.o. female.   Patient reports that she had an episode of her blood pressure dropping low and feeling dizzy.  Patient states that she took a oxycodone and a muscle relaxer prior to going to her physical therapy appointment.  Patient states she thought the medication would help her do better and have less pain at therapy.  Patient is taking the medication and going to physical therapy due to a recent knee surgery.  Patient reports she has done 4 weeks of physical therapy and does not feel like it is going well.  Patient states she has been eating and drinking normally.  Patient has a past medical history of hypertension GERD hypothyroidism and depression.  Patient states she did not lose consciousness.  She did not fall.  Patient denies any injuries.  Patient has not had any chest pain she has not had any shortness of breath.  Patient denies any headache  The history is provided by the patient. No language interpreter was used.  Near Syncope       Prior to Admission medications  Medication Sig Start Date End Date Taking? Authorizing Provider  amLODipine  (NORVASC ) 10 MG tablet Take 1 tablet (10 mg total) by mouth daily. 07/30/20 07/30/21  Jerri Keys, MD  amoxicillin -clavulanate (AUGMENTIN ) 875-125 MG tablet Take 1 tablet by mouth 2 (two) times daily. 01/07/23   Mohab Ashby K, PA-C  ascorbic acid (VITAMIN C) 500 MG tablet Take 500 mg by mouth daily.    [provider]  aspirin  EC 81 MG tablet Take 81 mg by mouth daily with supper.     [provider]  Bempedoic Acid (NEXLETOL) 180 MG TABS Take 180 mg by mouth daily with breakfast.    [provider]  carvedilol  (COREG ) 12.5 MG tablet Take 12.5 mg by mouth 2 (two) times daily with a meal.    [provider]  cetirizine (ZYRTEC) 10 MG tablet Take 10 mg by mouth daily.    [provider]  Cholecalciferol (VITAMIN D-3) 125 MCG (5000 UT) TABS Take 5,000 Units by mouth daily.    [provider]  Cinnamon 500 MG TABS Take 500 mg by mouth daily.    [provider]  cyclobenzaprine  (FLEXERIL ) 10 MG tablet Take 1 tablet (10 mg total) by mouth 2 (two) times daily as needed for muscle spasms. 05/22/23   Curatolo, Adam, DO  escitalopram  (LEXAPRO ) 20 MG tablet Take 20 mg by mouth daily with supper.    [provider]  Evolocumab (REPATHA) 140 MG/ML SOSY Inject 140 mg into the skin every 14 (fourteen) days.    [provider]  famotidine  (PEPCID ) 10 MG tablet Take 1 tablet (10 mg total) by mouth daily for 4 days. 07/30/20 08/03/20  Jerri Keys, MD  flecainide  (TAMBOCOR ) 50 MG tablet Take 50 mg by mouth 2 (two) times daily with a meal.     [provider]  Ginkgo Biloba 120 MG CAPS Take 120 mg by mouth daily.    [provider]  ibuprofen (ADVIL) 200 MG tablet Take 600 mg by mouth every 6 (six) hours as needed for fever or headache (pain).    [provider]  Lecithin 1000 MG CHEW Chew 1,000 mg by mouth daily.    [provider]  levothyroxine  (SYNTHROID , LEVOTHROID) 88 MCG tablet Take 88 mcg by mouth daily before breakfast.    [provider]  lidocaine  (LIDODERM ) 5 % Place 1 patch onto the skin daily. Remove & Discard patch within 12 hours or as directed by MD 04/17/24   Dreama Longs, MD  LORazepam  (ATIVAN ) 0.5 MG tablet Take 0.5 mg by mouth daily as needed for anxiety.    [provider]  meloxicam (MOBIC) 15 MG tablet Take 15 mg by mouth daily with breakfast.     [provider]  methylPREDNISolone  (MEDROL  DOSEPAK) 4 MG TBPK tablet 6,5,4,3,2,1 01/07/23   Ash Mcelwain K, PA-C  Multiple Minerals (MULTI-MINERALS) TABS Take 1 tablet by mouth daily.    [provider]  nitroGLYCERIN  (NITROSTAT ) 0.4 MG  SL tablet Place 0.4 mg under the tongue every 5 (five) minutes as needed for chest pain. 06/13/17   [provider]  Omega-3 Fatty Acids (FISH OIL) 1000 MG CAPS Take 1,000 mg by mouth daily.    [provider]  omeprazole  (PRILOSEC  OTC) 20 MG tablet Take 20 mg by mouth daily as needed (acid reflux/indigestion).    [provider]  OVER THE COUNTER MEDICATION Take 1,000 mg by mouth daily. EDTA    [provider]  predniSONE  (DELTASONE ) 10 MG tablet Label  & dispense according to the schedule below. 4 Pills PO on day one, 3Pills PO on day two, 2 Pills PO on day three, 1 Pills PO on day four,  then STOP.  Total of 10 tabs 07/30/20   Jerri Keys, MD    Allergies: Dilaudid [hydromorphone hcl] and Zetia [ezetimibe]    Review of Systems  Cardiovascular:  Positive for near-syncope.  Neurological:  Positive for dizziness and light-headedness.  All other systems reviewed and are negative.   Updated Vital Signs BP (!) 139/47   Pulse (!) 51   Temp 98.2 F (36.8 C) (Oral)   Resp 15   Wt 77.1 kg   LMP  (LMP Unknown)   SpO2 96%   BMI 32.12 kg/m   Physical Exam Vitals and nursing note reviewed.  Constitutional:      Appearance: Normal appearance. She is well-developed.  HENT:     Head: Normocephalic and atraumatic.     Right Ear: External ear normal.     Left Ear: External ear normal.     Nose: Nose normal.     Mouth/Throat:     Mouth: Mucous membranes are moist.  Cardiovascular:     Rate and Rhythm: Normal rate.  Pulmonary:     Effort: Pulmonary effort is normal.  Abdominal:     General: There is no distension.  Musculoskeletal:        General: Normal range of motion.     Cervical back: Normal range of motion.  Skin:    General: Skin is warm.  Neurological:     General: No focal deficit present.     Mental Status: She is alert and oriented to person, place, and time.  Psychiatric:        Mood and Affect: Mood normal.     (all labs ordered  are listed, but only abnormal results are displayed) Labs Reviewed  COMPREHENSIVE METABOLIC PANEL WITH GFR - Abnormal; Notable for the following components:      Result Value   Glucose, Bld 107 (*)    BUN 27 (*)    Creatinine, Ser 1.93 (*)    GFR, Estimated 27 (*)  All other components within normal limits  CBC WITH DIFFERENTIAL/PLATELET - Abnormal; Notable for the following components:   RBC 3.47 (*)    Hemoglobin 10.5 (*)    HCT 32.3 (*)    Platelets 412 (*)    All other components within normal limits  URINALYSIS, ROUTINE W REFLEX MICROSCOPIC  CBG MONITORING, ED  TROPONIN T, HIGH SENSITIVITY    EKG: EKG Interpretation Date/Time:  Friday October 15 2024 13:49:08 EST Ventricular Rate:  63 PR Interval:  147 QRS Duration:  83 QT Interval:  485 QTC Calculation: 497 R Axis:   69  Text Interpretation: NSR with artifact Confirmed by Ruthe Cornet [phone]) on 10/15/2024 1:57:54 PM  Radiology: No results found.   Procedures   Medications Ordered in the ED  lactated ringers  bolus 1,000 mL (1,000 mLs Intravenous New Bag/Given 10/15/24 1447)    Clinical Course as of 10/15/24 1533  Fri Oct 15, 2024  1515 Creatinine(!): 1.93 Baseline of 0.9 [RP]    Clinical Course User Index [RP] Yolande Lamar BROCKS, MD                                 Medical Decision Making Patient complains of dizziness and near syncope.  Patient states she thinks it is from taking a pain pill and a muscle relaxer before going to physical therapy.  Patient's therapy office took her blood pressure and her blood pressure was low.  Amount and/or Complexity of Data Reviewed External Data Reviewed: notes.    Details: Primary care notes reviewed patient's medications from primary care reviewed Labs: ordered. Decision-making details documented in ED Course.    Details: Labs ordered reviewed and interpreted troponin is 24 and 19.  Patient's hemoglobin is 10.5.  GFR is 27 creatinine is 1.9  Risk OTC  drugs. Prescription drug management. Risk Details: Patient is given 1.5 L of LR.  Patient is able to eat and drink without difficulty.  Patient's blood pressure is much improved.  AB med is rechecked.  Patient's GF, BUN and creatinine have improved.  Patient reports she is feeling much better she is no longer dizzy.  Patient is advised to watch sedations from pain medicine and muscle relaxer.  Patient is advised to stop taking chlorthalidone until rechecked by her primary care physician.  Patient is discharged to follow-up with her physician in stable condition.        Final diagnoses:  AKI (acute kidney injury)  Dehydration  Hypotension, unspecified hypotension type    ED Discharge Orders     None      An After Visit Summary was printed and given to the patient.     Kiyoko Mcguirt K, PA-C 10/15/24 2208    Yolande Lamar BROCKS, MD 10/23/24 2021  "
